# Patient Record
Sex: Female | Born: 1950 | Race: White | Hispanic: No | Marital: Married | Smoking: Never smoker
Health system: Southern US, Community
[De-identification: ages and names within clinical notes are randomized; demographics above are authoritative.]

## PROBLEM LIST (undated history)

## (undated) DIAGNOSIS — E785 Hyperlipidemia, unspecified: Secondary | ICD-10-CM

## (undated) DIAGNOSIS — T753XXA Motion sickness, initial encounter: Secondary | ICD-10-CM

## (undated) DIAGNOSIS — F32A Depression, unspecified: Secondary | ICD-10-CM

## (undated) DIAGNOSIS — M1990 Unspecified osteoarthritis, unspecified site: Secondary | ICD-10-CM

## (undated) DIAGNOSIS — F329 Major depressive disorder, single episode, unspecified: Secondary | ICD-10-CM

## (undated) DIAGNOSIS — I10 Essential (primary) hypertension: Secondary | ICD-10-CM

## (undated) DIAGNOSIS — R42 Dizziness and giddiness: Secondary | ICD-10-CM

## (undated) HISTORY — PX: CATARACT EXTRACTION: SUR2

## (undated) HISTORY — DX: Depression, unspecified: F32.A

## (undated) HISTORY — DX: Major depressive disorder, single episode, unspecified: F32.9

## (undated) HISTORY — DX: Essential (primary) hypertension: I10

## (undated) HISTORY — DX: Hyperlipidemia, unspecified: E78.5

---

## 1972-03-14 HISTORY — PX: TONSILECTOMY/ADENOIDECTOMY WITH MYRINGOTOMY: SHX6125

## 2003-10-30 LAB — HM COLONOSCOPY: HM Colonoscopy: NORMAL

## 2006-01-03 ENCOUNTER — Other Ambulatory Visit: Admission: RE | Admit: 2006-01-03 | Discharge: 2006-01-03 | Payer: Self-pay | Admitting: Internal Medicine

## 2006-02-09 ENCOUNTER — Encounter: Admission: RE | Admit: 2006-02-09 | Discharge: 2006-02-09 | Payer: Self-pay | Admitting: Internal Medicine

## 2006-10-24 ENCOUNTER — Ambulatory Visit: Payer: Self-pay | Admitting: Gastroenterology

## 2007-01-09 ENCOUNTER — Ambulatory Visit: Payer: Self-pay | Admitting: Internal Medicine

## 2008-01-02 ENCOUNTER — Ambulatory Visit: Payer: Self-pay | Admitting: Internal Medicine

## 2010-03-17 ENCOUNTER — Ambulatory Visit: Payer: Self-pay | Admitting: Specialist

## 2010-03-26 ENCOUNTER — Ambulatory Visit: Payer: Self-pay | Admitting: Specialist

## 2010-04-04 ENCOUNTER — Encounter: Payer: Self-pay | Admitting: Internal Medicine

## 2010-06-24 ENCOUNTER — Ambulatory Visit: Payer: Self-pay | Admitting: Family Medicine

## 2011-03-15 HISTORY — PX: TOE SURGERY: SHX1073

## 2011-03-19 ENCOUNTER — Telehealth: Payer: Self-pay | Admitting: *Deleted

## 2011-03-19 NOTE — Telephone Encounter (Signed)
OK for 1 month refill, then needs to be seen.

## 2011-03-19 NOTE — Telephone Encounter (Signed)
Pharm faxed RF request -  HCTZ 12.5mg  1 qd. OK for RF? Patient has not been seen and has no upcoming apts at this time.

## 2011-03-21 MED ORDER — HYDROCHLOROTHIAZIDE 12.5 MG PO CAPS: 12.5000 mg | ORAL_CAPSULE | Freq: Every day | ORAL | Status: DC

## 2011-03-21 NOTE — Telephone Encounter (Signed)
RF sent in, Please set pt up for 30 min OV w/Dr Dan Humphreys in the next 2 months. THANK YOU

## 2011-03-21 NOTE — Telephone Encounter (Signed)
Left message for pt to call office

## 2011-03-23 NOTE — Telephone Encounter (Signed)
Left message  For pt to call office

## 2011-03-25 NOTE — Telephone Encounter (Signed)
Tried call pt no answer on phone

## 2011-03-29 NOTE — Telephone Encounter (Signed)
Tried calling patient no answer on phone kept ringing

## 2011-03-31 ENCOUNTER — Encounter: Payer: Self-pay | Admitting: Internal Medicine

## 2011-04-04 NOTE — Telephone Encounter (Signed)
Trid callign patient no answer phone kept ringing

## 2011-11-30 LAB — HM PAP SMEAR: HM Pap smear: NORMAL

## 2011-12-08 ENCOUNTER — Ambulatory Visit: Payer: Self-pay | Admitting: Internal Medicine

## 2012-01-17 IMAGING — CT CT ABD-PELV W/ CM
1 of 2 series · 15 of 32 positions shown, 19 images · non-contrast
Comparison: none

REASON FOR EXAM: LLQ pain
COMMENTS:

PROCEDURE:     KCT - KCT ABDOMEN/PELVIS W  - June 24, 2010 [DATE]
RESULT:
TECHNIQUE: Helical 5 mm sections were obtained from the lung bases through
the pubic symphysis status post intravenous administration of 100 ml of
[id number] and oral contrast.

[Series 2: abd with 5.0 i40f 3 · axial · 0.98mm/px · z∈[-1094,-639]mm · 15 of 99 slices shown, 19 images]
[im 4/99  soft-tissue]
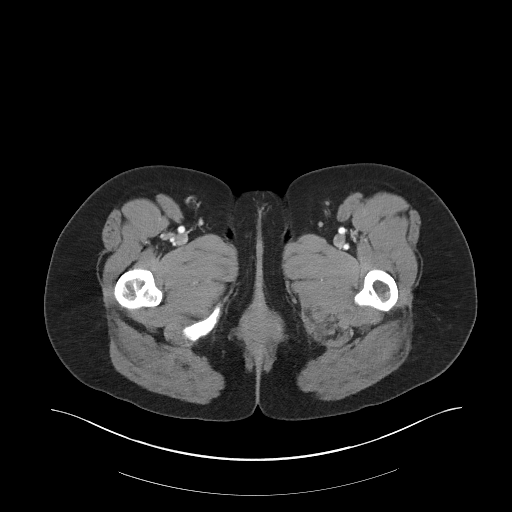
[im 4/99  bone]
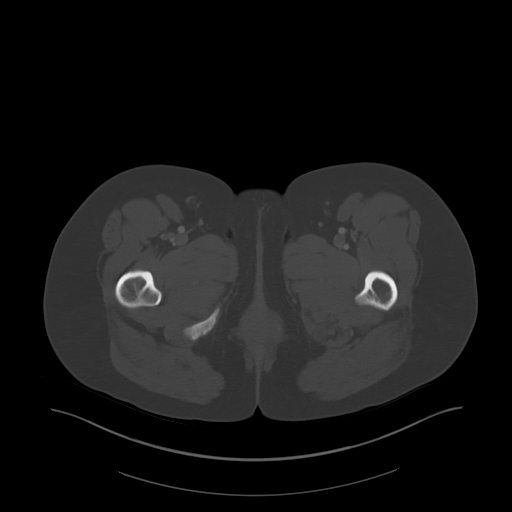
[im 12/99  soft-tissue]
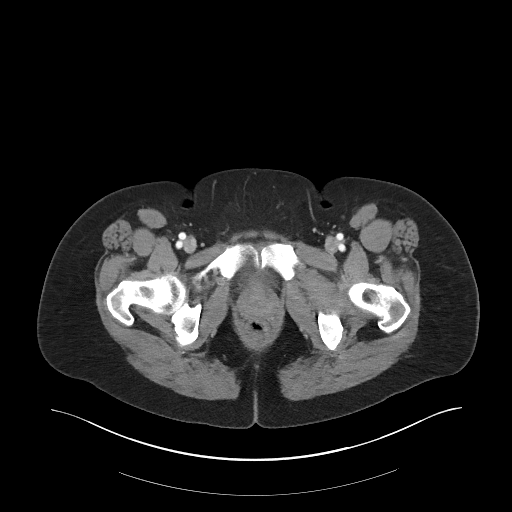
[im 20/99  soft-tissue]
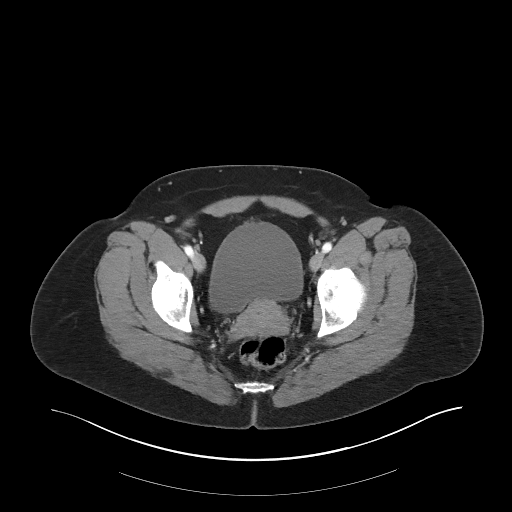
[im 28/99  soft-tissue]
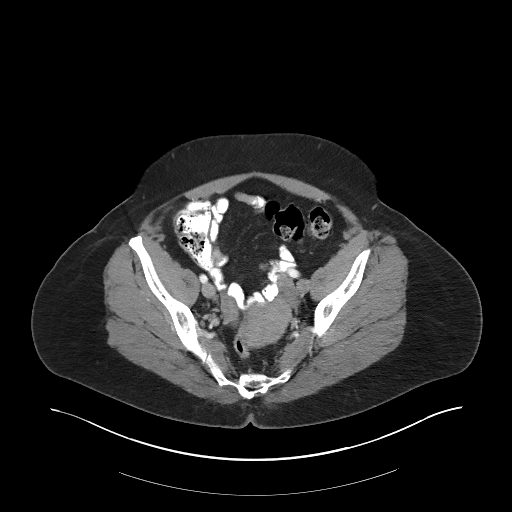
[im 36/99  soft-tissue]
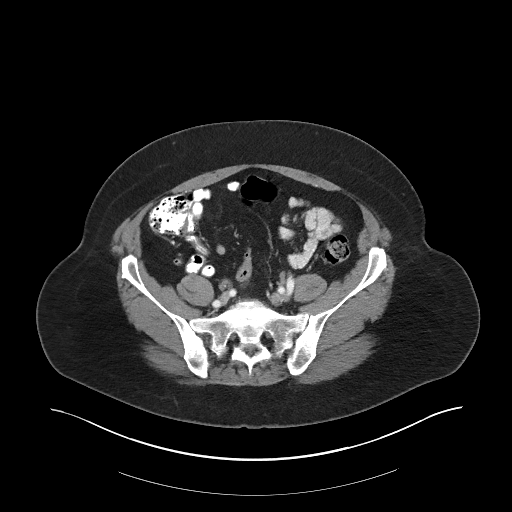
[im 44/99  soft-tissue]
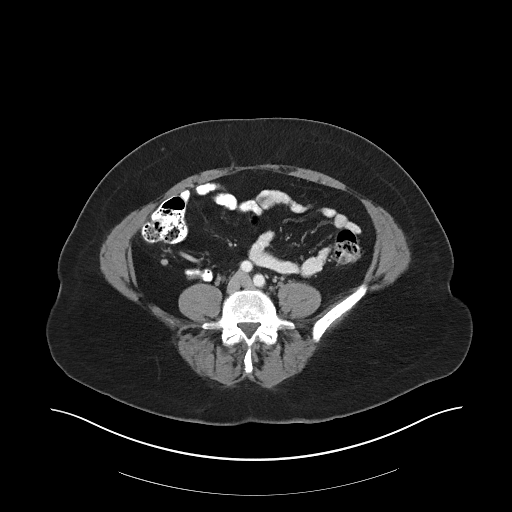
[im 51/99  soft-tissue]
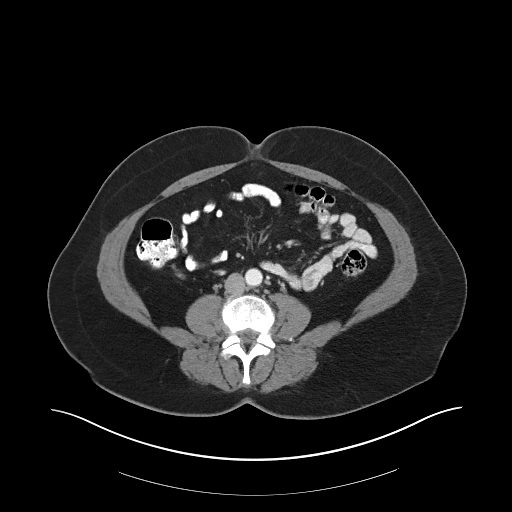
[im 55/99  soft-tissue]
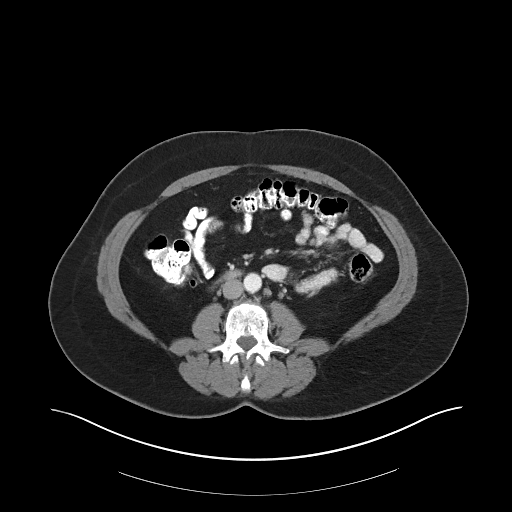
[im 63/99  soft-tissue]
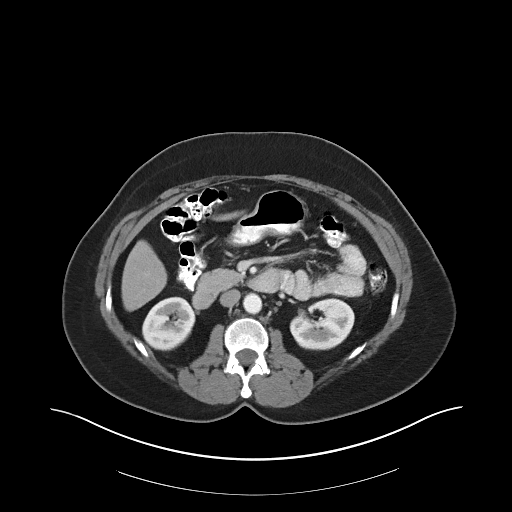
[im 63/99  bone]
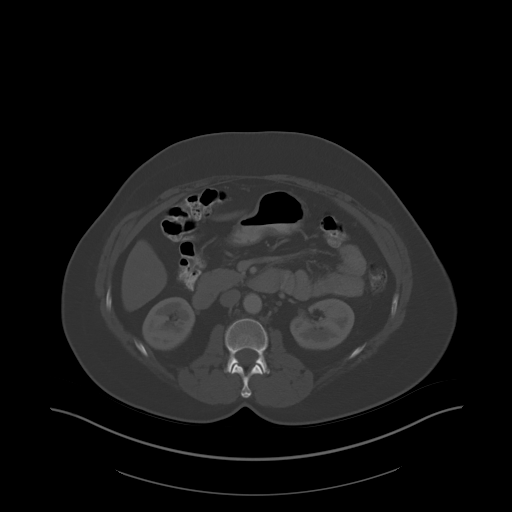
[im 71/99  soft-tissue]
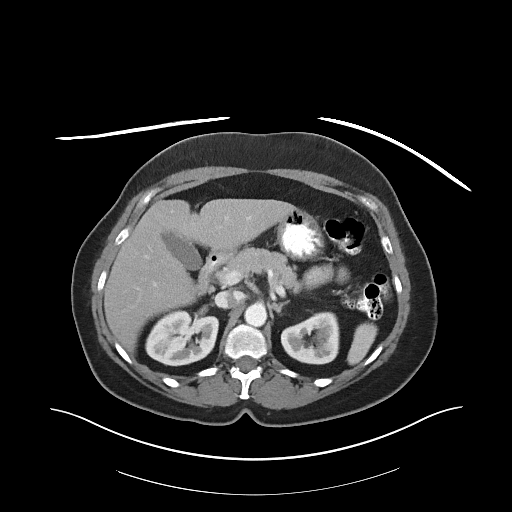
[im 79/99  soft-tissue]
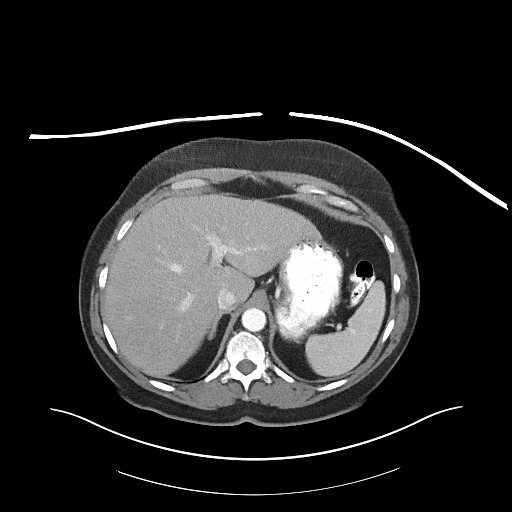
[im 83/99  lung]
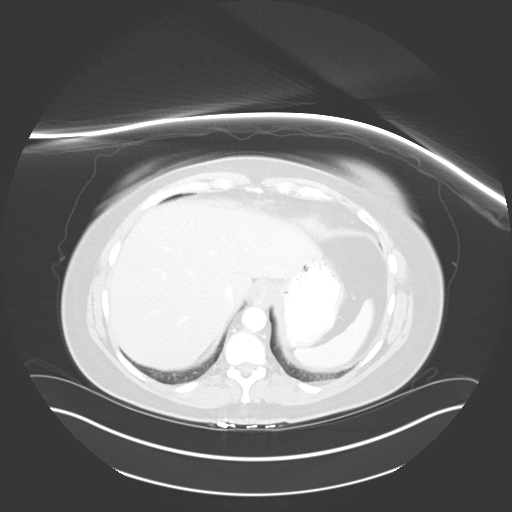
[im 87/99  soft-tissue]
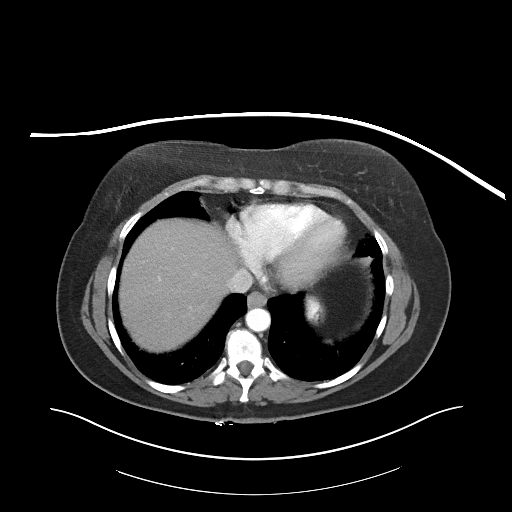
[im 87/99  lung]
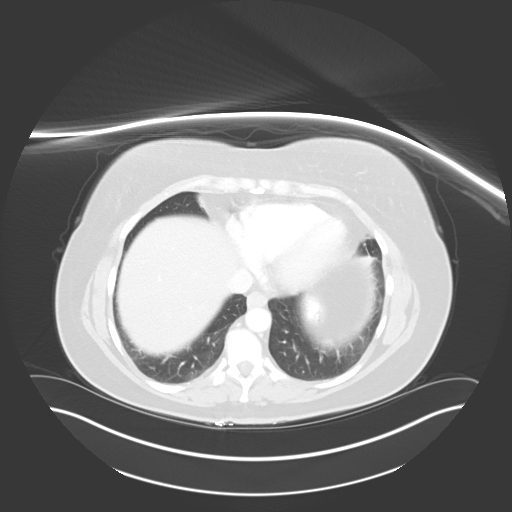
[im 91/99  lung]
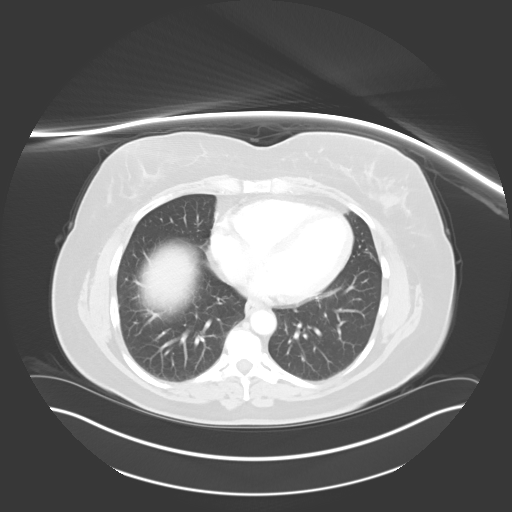
[im 95/99  soft-tissue]
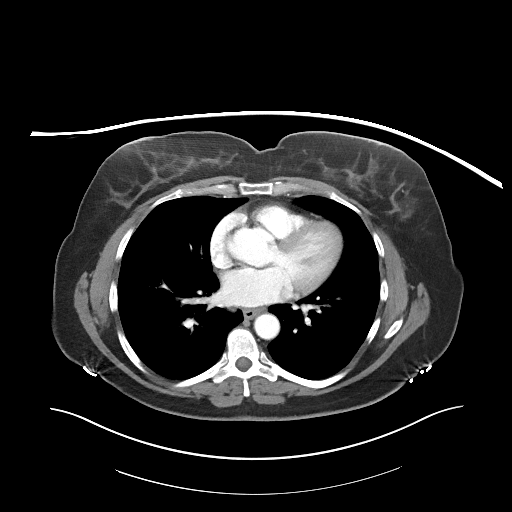
[im 95/99  lung]
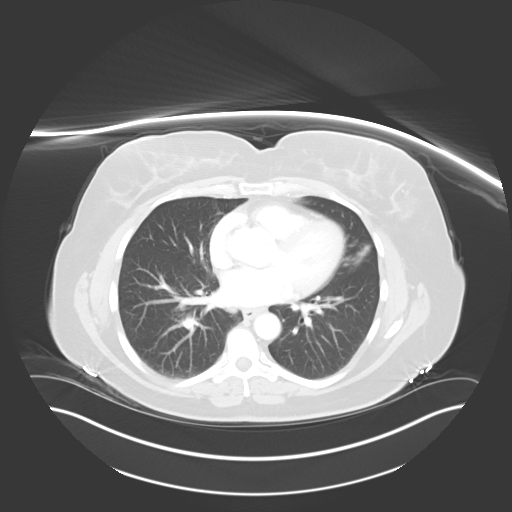

[15 of 32 positions shown; findings below may reference images not displayed]

FINDINGS: Mild, ill-defined increased density projects within the posterior
base of the lingula and likely representing an area of hypoventilation and
infiltrate is of much lower differential consideration. The lung bases are
otherwise unremarkable.

Evaluation of the liver demonstrates a 5 mm, hypoechoic nodule within the
medial segment of the left lobe of the liver. This area is too small for
characterization though likely represents a small biliary hamartoma versus a
small cyst or possibly a small hemangioma. This can be monitored, if and as
clinically warranted. No further liver masses or regions of abnormal
parenchymal enhancement are identified. The spleen, adrenals, pancreas, and
kidneys are unremarkable. There is no CT evidence of bowel obstruction or
secondary signs reflecting enteritis, colitis, diverticulitis or
appendicitis. The appendix is identified and appears unremarkable.

The celiac artery, SMA, IMA, SMV, and portal vein are opacified. There is no
evidence of an abdominal aortic aneurysm or dissection.

There is no evidence of abdominal or pelvic free fluid, loculated fluid
collections, masses, or adenopathy.
IMPRESSION: 1.  No CT evidence of obstructive or inflammatory abnormalities. There is no
evidence of focal or acute abnormalities.
2.  Likely hypoventilation at base of the lingula.
3.  Small, low attenuating indeterminate nodule within the liver with
differential considerations as described above.

## 2012-08-28 ENCOUNTER — Ambulatory Visit: Payer: Self-pay | Admitting: Internal Medicine

## 2012-10-29 ENCOUNTER — Encounter: Payer: Self-pay | Admitting: Internal Medicine

## 2012-10-29 ENCOUNTER — Ambulatory Visit (INDEPENDENT_AMBULATORY_CARE_PROVIDER_SITE_OTHER): Payer: BC Managed Care – PPO | Admitting: Internal Medicine

## 2012-10-29 ENCOUNTER — Encounter: Payer: Self-pay | Admitting: *Deleted

## 2012-10-29 VITALS — BP 126/90 | HR 72 | Temp 98.5°F | Ht 64.75 in | Wt 199.0 lb

## 2012-10-29 DIAGNOSIS — H6123 Impacted cerumen, bilateral: Secondary | ICD-10-CM

## 2012-10-29 DIAGNOSIS — H6120 Impacted cerumen, unspecified ear: Secondary | ICD-10-CM

## 2012-10-29 DIAGNOSIS — Z803 Family history of malignant neoplasm of breast: Secondary | ICD-10-CM

## 2012-10-29 DIAGNOSIS — Z0000 Encounter for general adult medical examination without abnormal findings: Secondary | ICD-10-CM

## 2012-10-29 DIAGNOSIS — R140 Abdominal distension (gaseous): Secondary | ICD-10-CM | POA: Insufficient documentation

## 2012-10-29 DIAGNOSIS — F4323 Adjustment disorder with mixed anxiety and depressed mood: Secondary | ICD-10-CM | POA: Insufficient documentation

## 2012-10-29 DIAGNOSIS — R141 Gas pain: Secondary | ICD-10-CM

## 2012-10-29 LAB — POCT URINALYSIS DIPSTICK
Ketones, UA: NEGATIVE
Protein, UA: NEGATIVE
Spec Grav, UA: 1.025
pH, UA: 7

## 2012-10-29 LAB — CBC WITH DIFFERENTIAL/PLATELET
Basophils Absolute: 0 10*3/uL (ref 0.0–0.1)
Eosinophils Absolute: 0.2 10*3/uL (ref 0.0–0.7)
Hemoglobin: 14.2 g/dL (ref 12.0–15.0)
Lymphocytes Relative: 33.2 % (ref 12.0–46.0)
Monocytes Relative: 4.7 % (ref 3.0–12.0)
Neutro Abs: 3.4 10*3/uL (ref 1.4–7.7)
Neutrophils Relative %: 59 % (ref 43.0–77.0)
Platelets: 265 10*3/uL (ref 150.0–400.0)
RDW: 13.7 % (ref 11.5–14.6)

## 2012-10-29 LAB — COMPREHENSIVE METABOLIC PANEL
CO2: 27 mEq/L (ref 19–32)
Calcium: 9.6 mg/dL (ref 8.4–10.5)
Chloride: 101 mEq/L (ref 96–112)
Creatinine, Ser: 0.9 mg/dL (ref 0.4–1.2)
GFR: 69.68 mL/min (ref >60.00)
Glucose, Bld: 94 mg/dL (ref 70–99)
Sodium: 138 mEq/L (ref 135–145)
Total Bilirubin: 0.3 mg/dL (ref 0.3–1.2)
Total Protein: 7.3 g/dL (ref 6.0–8.3)

## 2012-10-29 LAB — LIPID PANEL
Cholesterol: 255 mg/dL — ABNORMAL HIGH (ref 0–200)
HDL: 54.3 mg/dL (ref >39.00)
Triglycerides: 307 mg/dL — ABNORMAL HIGH (ref 0.0–149.0)
VLDL: 61.4 mg/dL — ABNORMAL HIGH (ref 0.0–40.0)

## 2012-10-29 LAB — TSH: TSH: 0.93 u[IU]/mL (ref 0.35–5.50)

## 2012-10-29 LAB — LDL CHOLESTEROL, DIRECT: Direct LDL: 166.7 mg/dL

## 2012-10-29 MED ORDER — ALPRAZOLAM 0.25 MG PO TABS: 0.2500 mg | ORAL_TABLET | Freq: Two times a day (BID) | ORAL | Status: DC | PRN

## 2012-10-29 MED ORDER — FLUOXETINE HCL 20 MG PO TABS: 20.0000 mg | ORAL_TABLET | Freq: Every day | ORAL | Status: DC

## 2012-10-29 NOTE — Assessment & Plan Note (Signed)
Symptoms are most consistent with adjustment disorder with mixed anxiety and depressed mood. Will start fluoxetine 20 mg daily. Will also use alprazolam as needed for episodes of severe anxiety. Followup in 4 weeks or sooner as needed.

## 2012-10-29 NOTE — Assessment & Plan Note (Signed)
Patient reports her sister was recently diagnosed with high-risk breast cancer with inherited genetic mutation. She will try to obtain records on this. Will set up genetic counseling at [facility] high-risk breast clinic. Will set up mammogram.

## 2012-10-29 NOTE — Assessment & Plan Note (Signed)
Several month history of abdominal bloating. Exam is normal today. Will get pelvic ultrasound for further evaluation of ovarian pathology. Note that patient reports a history in her sister of genetic mutation increasing risk for breast and ovarian cancer.

## 2012-10-29 NOTE — Progress Notes (Signed)
Subjective:    Patient ID: Angela Hogan, female    DOB: 09-18-1950, 62 y.o.   MRN: [id number]  HPI 62 year old female with history of hypertension, anxiety presents to establish care. Several years ago, she was seen in one of our other clinics. She would like to reestablish care here. She has not been taking any medications for several months. She has not been monitoring her blood pressure. She denies any chest pain, headache, palpitations. She does report increased anxiety and occasional depressed mood. She attributes this to ongoing family stressors as well as work stressors. She works as a Systems developer. Prior to the last few months, she has been taking alprazolam as needed for anxiety or sleep. She reports she tolerated this well. She also used sertraline in the past. She noted some improvement with this.  She reports that her sister was recently diagnosed with breast cancer. Her mother also had breast cancer. She reports that her sister was diagnosed with genetic mutation leading to breast cancer. She would like to be referred to genetic counseling for this. She would also like to have mammogram scheduled.  Over the last several months, she also notes some occasional diffuse pelvic bloating or pressure. Her previous doctor had ordered a pelvic ultrasound, approximately 3 years ago which she reports was normal. She denies any change in bowel habits, constipation, diarrhea, blood in her stool. She has a history of uterine fibroids and menorrhagia but has not had a menses in over 2 years. She also denies any symptoms of dysuria, hematuria, urinary urgency or frequency.  Outpatient Encounter Prescriptions as of 10/29/2012  Medication Sig Dispense Refill  . ALPRAZolam (XANAX) 0.25 MG tablet Take 1 tablet (0.25 mg total) by mouth 2 (two) times daily as needed for sleep or anxiety.  60 tablet  1   No facility-administered encounter medications on file as of 10/29/2012.   BP 126/90  Pulse 72   Temp(Src) 98.5 F (36.9 C) (Oral)  Ht 5' 4.75" (1.645 m)  Wt 199 lb (90.266 kg)  BMI 33.36 kg/m2  SpO2 95%  Review of Systems  Constitutional: Negative for fever, chills, appetite change, fatigue and unexpected weight change.  HENT: Negative for ear pain, congestion, sore throat, trouble swallowing, neck pain, voice change and sinus pressure.   Eyes: Negative for visual disturbance.  Respiratory: Negative for cough, shortness of breath, wheezing and stridor.   Cardiovascular: Negative for chest pain, palpitations and leg swelling.  Gastrointestinal: Positive for abdominal distention. Negative for nausea, vomiting, abdominal pain, diarrhea, constipation, blood in stool and anal bleeding.  Genitourinary: Negative for dysuria and flank pain.  Musculoskeletal: Negative for myalgias, arthralgias and gait problem.  Skin: Negative for color change and rash.  Neurological: Negative for dizziness and headaches.  Hematological: Negative for adenopathy. Does not bruise/bleed easily.  Psychiatric/Behavioral: Negative for suicidal ideas, sleep disturbance and dysphoric mood. The patient is not nervous/anxious.        Objective:   Physical Exam  Constitutional: She is oriented to person, place, and time. She appears well-developed and well-nourished. No distress.  HENT:  Head: Normocephalic and atraumatic.  Right Ear: External ear normal.  Left Ear: External ear normal.  Nose: Nose normal.  Mouth/Throat: Oropharynx is clear and moist. No oropharyngeal exudate.  Initially impacted with cerumen, which was removed with warm water lavage.  Eyes: Conjunctivae are normal. Pupils are equal, round, and reactive to light. Right eye exhibits no discharge. Left eye exhibits no discharge. No scleral icterus.  Neck: Normal  range of motion. Neck supple. No tracheal deviation present. No thyromegaly present.  Cardiovascular: Normal rate, regular rhythm, normal heart sounds and intact distal pulses.  Exam  reveals no gallop and no friction rub.   No murmur heard. Pulmonary/Chest: Effort normal and breath sounds normal. No accessory muscle usage. Not tachypneic. No respiratory distress. She has no decreased breath sounds. She has no wheezes. She has no rhonchi. She has no rales. She exhibits no tenderness.  Abdominal: Soft. Bowel sounds are normal. She exhibits no distension and no mass. There is no tenderness. There is no rebound and no guarding.  Musculoskeletal: Normal range of motion. She exhibits no edema and no tenderness.  Lymphadenopathy:    She has no cervical adenopathy.  Neurological: She is alert and oriented to person, place, and time. No cranial nerve deficit. She exhibits normal muscle tone. Coordination normal.  Skin: Skin is warm and dry. No rash noted. She is not diaphoretic. No erythema. No pallor.  Psychiatric: Her behavior is normal. Judgment and thought content normal. Her mood appears anxious.          Assessment & Plan:

## 2012-10-29 NOTE — Assessment & Plan Note (Signed)
Cerumen removed with warm water lavage. 

## 2012-10-30 ENCOUNTER — Encounter: Payer: Self-pay | Admitting: *Deleted

## 2012-10-31 ENCOUNTER — Ambulatory Visit: Payer: Self-pay | Admitting: Internal Medicine

## 2012-11-01 ENCOUNTER — Telehealth: Payer: Self-pay | Admitting: Internal Medicine

## 2012-11-01 NOTE — Telephone Encounter (Signed)
Left message to call back  

## 2012-11-01 NOTE — Telephone Encounter (Signed)
Ultrasound of the pelvis from 10/31/2012 showed nodular areas within the uterus consistent with fibroids. The largest measured 2.02x1.56x1.85 cm. Ovaries were normal.

## 2012-11-02 NOTE — Telephone Encounter (Signed)
Informed patient of her results, she verbally understood. However she stated she has pain sometimes that is really bad. Could you call something in for the pain when she have it, is there anything to do about her pain?

## 2012-11-02 NOTE — Telephone Encounter (Signed)
If she is having chronic pelvic pain, we should set up referral to GYN for further evaluation. In the interim, I would recommend trying Tylenol 1000mg  twice daily for pain and/or Ibuprofen 800mg  up to three times daily as needed for pain.

## 2012-11-03 ENCOUNTER — Encounter: Payer: Self-pay | Admitting: Internal Medicine

## 2012-11-05 NOTE — Telephone Encounter (Signed)
Left message for pt to return my call.

## 2012-11-09 NOTE — Telephone Encounter (Signed)
Patient never returned call  

## 2012-11-13 ENCOUNTER — Encounter: Payer: Self-pay | Admitting: Internal Medicine

## 2012-11-26 ENCOUNTER — Encounter: Payer: Self-pay | Admitting: *Deleted

## 2012-11-27 ENCOUNTER — Encounter: Payer: BC Managed Care – PPO | Admitting: Internal Medicine

## 2012-11-30 LAB — HM MAMMOGRAPHY: HM Mammogram: NORMAL

## 2012-12-20 ENCOUNTER — Ambulatory Visit: Payer: Self-pay

## 2012-12-21 ENCOUNTER — Encounter: Payer: Self-pay | Admitting: Internal Medicine

## 2013-01-17 ENCOUNTER — Other Ambulatory Visit: Payer: Self-pay

## 2013-05-02 ENCOUNTER — Ambulatory Visit: Payer: Self-pay

## 2013-06-04 ENCOUNTER — Other Ambulatory Visit: Payer: Self-pay | Admitting: Internal Medicine

## 2013-06-11 ENCOUNTER — Other Ambulatory Visit: Payer: Self-pay | Admitting: Internal Medicine

## 2013-07-09 ENCOUNTER — Other Ambulatory Visit: Payer: Self-pay | Admitting: Internal Medicine

## 2013-07-24 ENCOUNTER — Telehealth: Payer: Self-pay | Admitting: Internal Medicine

## 2013-07-24 NOTE — Telephone Encounter (Signed)
Pt will need an appt for a medication refill within the next 30 days

## 2013-07-24 NOTE — Telephone Encounter (Signed)
Pt left vm.  States she needs refill and appt.  Returned pt call.  States for rx fluoxetine her pharmacy told her we would have to call in again.  States she has been out for about a week.  Angela Hogan.  States they deliver and she wants [place] to call them so they will deliver her medication.  Appt made.

## 2013-07-24 NOTE — Telephone Encounter (Signed)
LMTCB, advised pt to call to make appt for further med refills.

## 2013-07-25 ENCOUNTER — Other Ambulatory Visit: Payer: Self-pay | Admitting: *Deleted

## 2013-07-25 MED ORDER — FLUOXETINE HCL 20 MG PO CAPS: 20.0000 mg | ORAL_CAPSULE | Freq: Every day | ORAL | Status: DC

## 2013-07-25 NOTE — Telephone Encounter (Signed)
Appt made and refill sent to pharmacy 

## 2013-08-08 ENCOUNTER — Ambulatory Visit (INDEPENDENT_AMBULATORY_CARE_PROVIDER_SITE_OTHER): Payer: BC Managed Care – PPO | Admitting: Internal Medicine

## 2013-08-08 ENCOUNTER — Encounter: Payer: Self-pay | Admitting: Internal Medicine

## 2013-08-08 VITALS — BP 138/90 | HR 75 | Temp 97.7°F | Ht 64.75 in | Wt 208.5 lb

## 2013-08-08 DIAGNOSIS — F4323 Adjustment disorder with mixed anxiety and depressed mood: Secondary | ICD-10-CM

## 2013-08-08 DIAGNOSIS — E669 Obesity, unspecified: Secondary | ICD-10-CM | POA: Insufficient documentation

## 2013-08-08 DIAGNOSIS — R5383 Other fatigue: Principal | ICD-10-CM

## 2013-08-08 DIAGNOSIS — I10 Essential (primary) hypertension: Secondary | ICD-10-CM | POA: Insufficient documentation

## 2013-08-08 DIAGNOSIS — Z0000 Encounter for general adult medical examination without abnormal findings: Secondary | ICD-10-CM

## 2013-08-08 DIAGNOSIS — R5381 Other malaise: Secondary | ICD-10-CM | POA: Insufficient documentation

## 2013-08-08 MED ORDER — FLUOXETINE HCL 20 MG PO CAPS: 20.0000 mg | ORAL_CAPSULE | Freq: Every day | ORAL | Status: DC

## 2013-08-08 MED ORDER — ALPRAZOLAM 0.25 MG PO TABS: 0.2500 mg | ORAL_TABLET | Freq: Two times a day (BID) | ORAL | Status: DC | PRN

## 2013-08-08 MED ORDER — HYDROCHLOROTHIAZIDE 12.5 MG PO CAPS: 12.5000 mg | ORAL_CAPSULE | Freq: Every day | ORAL | Status: DC

## 2013-08-08 MED ORDER — ZOSTER VACCINE LIVE 19400 UNT/0.65ML ~~LOC~~ SOLR
0.6500 mL | Freq: Once | SUBCUTANEOUS | Status: DC
Start: 2013-08-08 — End: 2013-10-30

## 2013-08-08 NOTE — Assessment & Plan Note (Signed)
Generalized fatigue noted by pt. Symptoms of fatigue and snoring concerning for sleep apnea. Will set up sleep study. Will check CBC, CMP, TSH with labs. Follow up in 10/2013.

## 2013-08-08 NOTE — Assessment & Plan Note (Signed)
Offered support today. Will continue Fluoxetine and prn alprazolam. Consider counseling.

## 2013-08-08 NOTE — Progress Notes (Signed)
Pre visit review using our clinic review tool, if applicable. No additional management support is needed unless otherwise documented below in the visit note. 

## 2013-08-08 NOTE — Assessment & Plan Note (Signed)
BP Readings from Last 3 Encounters:  08/08/13 138/90  10/29/12 126/90   Has been off BP meds. BP elevated. Will restart HCTZ. Follow up in 10/2013 to recheck BP and will have labs including renal function prior to this visit.

## 2013-08-08 NOTE — Assessment & Plan Note (Signed)
Wt Readings from Last 3 Encounters:  08/08/13 208 lb 8 oz (94.575 kg)  10/29/12 199 lb (90.266 kg)   Encouraged healthy diet and exercise with goal of weight loss. Will check CMP, TSH with labs.

## 2013-08-08 NOTE — Progress Notes (Signed)
Subjective:    Patient ID: Angela Hogan, female    DOB: 03/06/1951, 63 y.o.   MRN: [id number]  HPI 62YO female presents for follow up. Lost to follow up x1 year. Difficult time for her. Caring for parents and brother who is alcoholic.  Feeling tired all of the time. Snores at night. Concerned about weight gain. No chest pain, shortness of breath. Had stress test within 5 years which was normal per her report. Worried about her risk of heart disease with weight gain.   Review of Systems  Constitutional: Positive for fatigue. Negative for fever, chills, appetite change and unexpected weight change.  HENT: Negative for congestion, ear pain, sinus pressure, sore throat, trouble swallowing and voice change.   Eyes: Negative for visual disturbance.  Respiratory: Negative for cough, shortness of breath, wheezing and stridor.   Cardiovascular: Negative for chest pain, palpitations and leg swelling.  Gastrointestinal: Negative for nausea, vomiting, abdominal pain, diarrhea, constipation, blood in stool, abdominal distention and anal bleeding.  Genitourinary: Negative for dysuria and flank pain.  Musculoskeletal: Negative for arthralgias, gait problem, myalgias and neck pain.  Skin: Negative for color change and rash.  Neurological: Negative for dizziness and headaches.  Hematological: Negative for adenopathy. Does not bruise/bleed easily.  Psychiatric/Behavioral: Positive for sleep disturbance and dysphoric mood. Negative for suicidal ideas. The patient is nervous/anxious.        Objective:    BP 138/90  Pulse 75  Temp(Src) 97.7 F (36.5 C) (Oral)  Ht 5' 4.75" (1.645 m)  Wt 208 lb 8 oz (94.575 kg)  BMI 34.95 kg/m2  SpO2 95% Physical Exam  Constitutional: She is oriented to person, place, and time. She appears well-developed and well-nourished. No distress.  HENT:  Head: Normocephalic and atraumatic.  Right Ear: External ear normal.  Left Ear: External ear normal.  Nose: Nose  normal.  Mouth/Throat: Oropharynx is clear and moist. No oropharyngeal exudate.  Eyes: Conjunctivae are normal. Pupils are equal, round, and reactive to light. Right eye exhibits no discharge. Left eye exhibits no discharge. No scleral icterus.  Neck: Normal range of motion. Neck supple. No tracheal deviation present. No thyromegaly present.  Cardiovascular: Normal rate, regular rhythm, normal heart sounds and intact distal pulses.  Exam reveals no gallop and no friction rub.   No murmur heard. Pulmonary/Chest: Effort normal and breath sounds normal. No accessory muscle usage. Not tachypneic. No respiratory distress. She has no decreased breath sounds. She has no wheezes. She has no rhonchi. She has no rales. She exhibits no tenderness.  Musculoskeletal: Normal range of motion. She exhibits no edema and no tenderness.  Lymphadenopathy:    She has no cervical adenopathy.  Neurological: She is alert and oriented to person, place, and time. No cranial nerve deficit. She exhibits normal muscle tone. Coordination normal.  Skin: Skin is warm and dry. No rash noted. She is not diaphoretic. No erythema. No pallor.  Psychiatric: Her behavior is normal. Judgment and thought content normal. Her mood appears anxious. She exhibits a depressed mood.          Assessment & Plan:   Problem List Items Addressed This Visit     Unprioritized   Adjustment disorder with mixed anxiety and depressed mood     Offered support today. Will continue Fluoxetine and prn alprazolam. Consider counseling.    Relevant Medications      FLUoxetine (PROZAC) capsule      ALPRAZolam (XANAX) tablet   Essential hypertension, benign  BP Readings from Last 3 Encounters:  08/08/13 138/90  10/29/12 126/90   Has been off BP meds. BP elevated. Will restart HCTZ. Follow up in 10/2013 to recheck BP and will have labs including renal function prior to this visit.    Relevant Medications      hydrochlorothiazide (MICROZIDE)  12.5 MG capsule   Obesity (BMI 30-39.9)      Wt Readings from Last 3 Encounters:  08/08/13 208 lb 8 oz (94.575 kg)  10/29/12 199 lb (90.266 kg)   Encouraged healthy diet and exercise with goal of weight loss. Will check CMP, TSH with labs.    Other malaise and fatigue - Primary     Generalized fatigue noted by pt. Symptoms of fatigue and snoring concerning for sleep apnea. Will set up sleep study. Will check CBC, CMP, TSH with labs. Follow up in 10/2013.    Relevant Orders      Ambulatory referral to Sleep Studies   Routine general medical examination at a health care facility   Relevant Orders      CBC with Differential      Comprehensive metabolic panel      Lipid panel      Microalbumin / creatinine urine ratio      Vit D  25 hydroxy (rtn osteoporosis monitoring)      TSH       No Follow-up on file.

## 2013-08-27 ENCOUNTER — Telehealth: Payer: Self-pay | Admitting: *Deleted

## 2013-08-27 NOTE — Telephone Encounter (Signed)
Received a questionnaire for home sleep study from [facility].  Pt states that she is unable to have the study done at this time due to the bill she received from her husbands sleep study.

## 2013-10-04 ENCOUNTER — Telehealth: Payer: Self-pay | Admitting: *Deleted

## 2013-10-04 NOTE — Telephone Encounter (Signed)
Pt called stating that they have a newborn in the family and is wanting to know if she has ever had her "whooping cough" vaccine. No Tdap vaccination in our records. Okay for her to schedule a nurse visit to get this vaccination?

## 2013-10-04 NOTE — Telephone Encounter (Signed)
Yes, fine for her to get TdaP

## 2013-10-07 NOTE — Telephone Encounter (Signed)
Left vm notifying pt to call the office to schedule a nurse visit for her TdaP vaccination

## 2013-10-30 ENCOUNTER — Telehealth: Payer: Self-pay | Admitting: Internal Medicine

## 2013-10-30 ENCOUNTER — Encounter: Payer: Self-pay | Admitting: Internal Medicine

## 2013-10-30 ENCOUNTER — Ambulatory Visit (INDEPENDENT_AMBULATORY_CARE_PROVIDER_SITE_OTHER): Payer: BC Managed Care – PPO | Admitting: Internal Medicine

## 2013-10-30 VITALS — BP 120/88 | HR 70 | Temp 98.0°F | Ht 64.3 in | Wt 206.5 lb

## 2013-10-30 DIAGNOSIS — E669 Obesity, unspecified: Secondary | ICD-10-CM

## 2013-10-30 DIAGNOSIS — E785 Hyperlipidemia, unspecified: Secondary | ICD-10-CM

## 2013-10-30 DIAGNOSIS — D259 Leiomyoma of uterus, unspecified: Secondary | ICD-10-CM | POA: Insufficient documentation

## 2013-10-30 DIAGNOSIS — Z1211 Encounter for screening for malignant neoplasm of colon: Secondary | ICD-10-CM

## 2013-10-30 DIAGNOSIS — I10 Essential (primary) hypertension: Secondary | ICD-10-CM

## 2013-10-30 DIAGNOSIS — F4323 Adjustment disorder with mixed anxiety and depressed mood: Secondary | ICD-10-CM

## 2013-10-30 DIAGNOSIS — Z0000 Encounter for general adult medical examination without abnormal findings: Secondary | ICD-10-CM

## 2013-10-30 LAB — CBC WITH DIFFERENTIAL/PLATELET
BASOS ABS: 0 10*3/uL (ref 0.0–0.1)
Basophils Relative: 0.5 % (ref 0.0–3.0)
EOS ABS: 0.1 10*3/uL (ref 0.0–0.7)
Eosinophils Relative: 2.2 % (ref 0.0–5.0)
HEMATOCRIT: 40.8 % (ref 36.0–46.0)
HEMOGLOBIN: 13.8 g/dL (ref 12.0–15.0)
LYMPHS ABS: 2.4 10*3/uL (ref 0.7–4.0)
Lymphocytes Relative: 36.5 % (ref 12.0–46.0)
MCHC: 33.9 g/dL (ref 30.0–36.0)
MCV: 90.6 fl (ref 78.0–100.0)
MONOS PCT: 5.4 % (ref 3.0–12.0)
Monocytes Absolute: 0.4 10*3/uL (ref 0.1–1.0)
NEUTROS ABS: 3.6 10*3/uL (ref 1.4–7.7)
Neutrophils Relative %: 55.4 % (ref 43.0–77.0)
Platelets: 281 10*3/uL (ref 150.0–400.0)
RBC: 4.5 Mil/uL (ref 3.87–5.11)
RDW: 13.5 % (ref 11.5–15.5)
WBC: 6.6 10*3/uL (ref 4.0–10.5)

## 2013-10-30 LAB — COMPREHENSIVE METABOLIC PANEL
ALT: 27 U/L (ref 0–35)
AST: 20 U/L (ref 0–37)
Albumin: 3.9 g/dL (ref 3.5–5.2)
Alkaline Phosphatase: 63 U/L (ref 39–117)
BILIRUBIN TOTAL: 0.7 mg/dL (ref 0.2–1.2)
BUN: 15 mg/dL (ref 6–23)
CO2: 26 meq/L (ref 19–32)
CREATININE: 0.8 mg/dL (ref 0.4–1.2)
Calcium: 9.4 mg/dL (ref 8.4–10.5)
Chloride: 103 mEq/L (ref 96–112)
GFR: 76.42 mL/min (ref >60.00)
Glucose, Bld: 97 mg/dL (ref 70–99)
Potassium: 4.1 mEq/L (ref 3.5–5.1)
Sodium: 138 mEq/L (ref 135–145)
Total Protein: 7 g/dL (ref 6.0–8.3)

## 2013-10-30 LAB — TSH: TSH: 0.79 u[IU]/mL (ref 0.35–4.50)

## 2013-10-30 LAB — LIPID PANEL
Cholesterol: 285 mg/dL — ABNORMAL HIGH (ref 0–200)
HDL: 56.3 mg/dL (ref >39.00)
NonHDL: 228.7
Total CHOL/HDL Ratio: 5
Triglycerides: 237 mg/dL — ABNORMAL HIGH (ref 0.0–149.0)
VLDL: 47.4 mg/dL — AB (ref 0.0–40.0)

## 2013-10-30 LAB — MICROALBUMIN / CREATININE URINE RATIO
Creatinine,U: 53.9 mg/dL
MICROALB UR: 0.6 mg/dL (ref 0.0–1.9)
Microalb Creat Ratio: 1.1 mg/g (ref 0.0–30.0)

## 2013-10-30 LAB — LDL CHOLESTEROL, DIRECT: LDL DIRECT: 216 mg/dL

## 2013-10-30 MED ORDER — ALPRAZOLAM 0.25 MG PO TABS: 0.2500 mg | ORAL_TABLET | Freq: Three times a day (TID) | ORAL | Status: DC | PRN

## 2013-10-30 MED ORDER — PHENTERMINE HCL 37.5 MG PO CAPS: 37.5000 mg | ORAL_CAPSULE | ORAL | Status: DC

## 2013-10-30 NOTE — Telephone Encounter (Signed)
Relevant patient education assigned to patient using Emmi. ° °

## 2013-10-30 NOTE — Patient Instructions (Addendum)
Start Fluoxetine $RemoveBeforeDE'20mg'ltQpEkjHgUeCuon$  daily for 1 week, then increase to $RemoveBef'40mg'WKraREfZyq$  daily.  Follow up in 4 weeks.  Health Maintenance Adopting a healthy lifestyle and getting preventive care can go a long way to promote health and wellness. Talk with your health care provider about what schedule of regular examinations is right for you. This is a good chance for you to check in with your provider about disease prevention and staying healthy. In between checkups, there are plenty of things you can do on your own. Experts have done a lot of research about which lifestyle changes and preventive measures are most likely to keep you healthy. Ask your health care provider for more information. WEIGHT AND DIET  Eat a healthy diet  Be sure to include plenty of vegetables, fruits, low-fat dairy products, and lean protein.  Do not eat a lot of foods high in solid fats, added sugars, or salt.  Get regular exercise. This is one of the most important things you can do for your health.  Most adults should exercise for at least 150 minutes each week. The exercise should increase your heart rate and make you sweat (moderate-intensity exercise).  Most adults should also do strengthening exercises at least twice a week. This is in addition to the moderate-intensity exercise.  Maintain a healthy weight  Body mass index (BMI) is a measurement that can be used to identify possible weight problems. It estimates body fat based on height and weight. Your health care provider can help determine your BMI and help you achieve or maintain a healthy weight.  For females 23 years of age and older:   A BMI below 18.5 is considered underweight.  A BMI of 18.5 to 24.9 is normal.  A BMI of 25 to 29.9 is considered overweight.  A BMI of 30 and above is considered obese.  Watch levels of cholesterol and blood lipids  You should start having your blood tested for lipids and cholesterol at 61 years of age, then have this test every 5  years.  You may need to have your cholesterol levels checked more often if:  Your lipid or cholesterol levels are high.  You are older than 61 years of age.  You are at high risk for heart disease.  CANCER SCREENING   Lung Cancer  Lung cancer screening is recommended for adults 44-6 years old who are at high risk for lung cancer because of a history of smoking.  A yearly low-dose CT scan of the lungs is recommended for people who:  Currently smoke.  Have quit within the past 15 years.  Have at least a 30-pack-year history of smoking. A pack year is smoking an average of one pack of cigarettes a day for 1 year.  Yearly screening should continue until it has been 15 years since you quit.  Yearly screening should stop if you develop a health problem that would prevent you from having lung cancer treatment.  Breast Cancer  Practice breast self-awareness. This means understanding how your breasts normally appear and feel.  It also means doing regular breast self-exams. Let your health care provider know about any changes, no matter how small.  If you are in your 20s or 30s, you should have a clinical breast exam (CBE) by a health care provider every 1-3 years as part of a regular health exam.  If you are 72 or older, have a CBE every year. Also consider having a breast X-ray (mammogram) every year.  If you have  a family history of breast cancer, talk to your health care provider about genetic screening.  If you are at high risk for breast cancer, talk to your health care provider about having an MRI and a mammogram every year.  Breast cancer gene (BRCA) assessment is recommended for women who have family members with BRCA-related cancers. BRCA-related cancers include:  Breast.  Ovarian.  Tubal.  Peritoneal cancers.  Results of the assessment will determine the need for genetic counseling and BRCA1 and BRCA2 testing. Cervical Cancer Routine pelvic examinations to  screen for cervical cancer are no longer recommended for nonpregnant women who are considered low risk for cancer of the pelvic organs (ovaries, uterus, and vagina) and who do not have symptoms. A pelvic examination may be necessary if you have symptoms including those associated with pelvic infections. Ask your health care provider if a screening pelvic exam is right for you.   The Pap test is the screening test for cervical cancer for women who are considered at risk.  If you had a hysterectomy for a problem that was not cancer or a condition that could lead to cancer, then you no longer need Pap tests.  If you are older than 65 years, and you have had normal Pap tests for the past 10 years, you no longer need to have Pap tests.  If you have had past treatment for cervical cancer or a condition that could lead to cancer, you need Pap tests and screening for cancer for at least 20 years after your treatment.  If you no longer get a Pap test, assess your risk factors if they change (such as having a new sexual partner). This can affect whether you should start being screened again.  Some women have medical problems that increase their chance of getting cervical cancer. If this is the case for you, your health care provider may recommend more frequent screening and Pap tests.  The human papillomavirus (HPV) test is another test that may be used for cervical cancer screening. The HPV test looks for the virus that can cause cell changes in the cervix. The cells collected during the Pap test can be tested for HPV.  The HPV test can be used to screen women 58 years of age and older. Getting tested for HPV can extend the interval between normal Pap tests from three to five years.  An HPV test also should be used to screen women of any age who have unclear Pap test results.  After 62 years of age, women should have HPV testing as often as Pap tests.  Colorectal Cancer  This type of cancer can be  detected and often prevented.  Routine colorectal cancer screening usually begins at 61 years of age and continues through 61 years of age.  Your health care provider may recommend screening at an earlier age if you have risk factors for colon cancer.  Your health care provider may also recommend using home test kits to check for hidden blood in the stool.  A small camera at the end of a tube can be used to examine your colon directly (sigmoidoscopy or colonoscopy). This is done to check for the earliest forms of colorectal cancer.  Routine screening usually begins at age 16.  Direct examination of the colon should be repeated every 5-10 years through 61 years of age. However, you may need to be screened more often if early forms of precancerous polyps or small growths are found. Skin Cancer  Check your  skin from head to toe regularly.  Tell your health care provider about any new moles or changes in moles, especially if there is a change in a mole's shape or color.  Also tell your health care provider if you have a mole that is larger than the size of a pencil eraser.  Always use sunscreen. Apply sunscreen liberally and repeatedly throughout the day.  Protect yourself by wearing long sleeves, pants, a wide-brimmed hat, and sunglasses whenever you are outside. HEART DISEASE, DIABETES, AND HIGH BLOOD PRESSURE   Have your blood pressure checked at least every 1-2 years. High blood pressure causes heart disease and increases the risk of stroke.  If you are between 9 years and 46 years old, ask your health care provider if you should take aspirin to prevent strokes.  Have regular diabetes screenings. This involves taking a blood sample to check your fasting blood sugar level.  If you are at a normal weight and have a low risk for diabetes, have this test once every three years after 61 years of age.  If you are overweight and have a high risk for diabetes, consider being tested at a  younger age or more often. PREVENTING INFECTION  Hepatitis B  If you have a higher risk for hepatitis B, you should be screened for this virus. You are considered at high risk for hepatitis B if:  You were born in a country where hepatitis B is common. Ask your health care provider which countries are considered high risk.  Your parents were born in a high-risk country, and you have not been immunized against hepatitis B (hepatitis B vaccine).  You have HIV or AIDS.  You use needles to inject street drugs.  You live with someone who has hepatitis B.  You have had sex with someone who has hepatitis B.  You get hemodialysis treatment.  You take certain medicines for conditions, including cancer, organ transplantation, and autoimmune conditions. Hepatitis C  Blood testing is recommended for:  Everyone born from [birth year] through 1963.  Anyone with known risk factors for hepatitis C. Sexually transmitted infections (STIs)  You should be screened for sexually transmitted infections (STIs) including gonorrhea and chlamydia if:  You are sexually active and are younger than 61 years of age.  You are older than 61 years of age and your health care provider tells you that you are at risk for this type of infection.  Your sexual activity has changed since you were last screened and you are at an increased risk for chlamydia or gonorrhea. Ask your health care provider if you are at risk.  If you do not have HIV, but are at risk, it may be recommended that you take a prescription medicine daily to prevent HIV infection. This is called pre-exposure prophylaxis (PrEP). You are considered at risk if:  You are sexually active and do not regularly use condoms or know the HIV status of your partner(s).  You take drugs by injection.  You are sexually active with a partner who has HIV. Talk with your health care provider about whether you are at high risk of being infected with HIV. If you choose  to begin PrEP, you should first be tested for HIV. You should then be tested every 3 months for as long as you are taking PrEP.  PREGNANCY   If you are premenopausal and you may become pregnant, ask your health care provider about preconception counseling.  If you may become pregnant, take 400  to 800 micrograms (mcg) of folic acid every day.  If you want to prevent pregnancy, talk to your health care provider about birth control (contraception). OSTEOPOROSIS AND MENOPAUSE   Osteoporosis is a disease in which the bones lose minerals and strength with aging. This can result in serious bone fractures. Your risk for osteoporosis can be identified using a bone density scan.  If you are 47 years of age or older, or if you are at risk for osteoporosis and fractures, ask your health care provider if you should be screened.  Ask your health care provider whether you should take a calcium or vitamin D supplement to lower your risk for osteoporosis.  Menopause may have certain physical symptoms and risks.  Hormone replacement therapy may reduce some of these symptoms and risks. Talk to your health care provider about whether hormone replacement therapy is right for you.  HOME CARE INSTRUCTIONS   Schedule regular health, dental, and eye exams.  Stay current with your immunizations.   Do not use any tobacco products including cigarettes, chewing tobacco, or electronic cigarettes.  If you are pregnant, do not drink alcohol.  If you are breastfeeding, limit how much and how often you drink alcohol.  Limit alcohol intake to no more than 1 drink per day for nonpregnant women. One drink equals 12 ounces of beer, 5 ounces of wine, or 1 ounces of hard liquor.  Do not use street drugs.  Do not share needles.  Ask your health care provider for help if you need support or information about quitting drugs.  Tell your health care provider if you often feel depressed.  Tell your health care  provider if you have ever been abused or do not feel safe at home. Document Released: 09/13/2010 Document Revised: 07/15/2013 Document Reviewed: 01/30/2013 [facility] Patient Information 2015 [place], [place]. This information is not intended to replace advice given to you by your health care provider. Make sure you discuss any questions you have with your health care provider.

## 2013-10-30 NOTE — Progress Notes (Signed)
Subjective:    Patient ID: Angela Hogan, female    DOB: 10/04/1950, 63 y.o.   MRN: [id number]  HPI 62YO female presents for physical exam.  Recently trying to lose weight. Walking daily. Following healthy diet. Frustrated by lack of weight loss.  Also feels more irritable lately with depressed mood. Has stopped Fluoxetine. Father recently passed away last week. Continues to use Alprazolam prn at night for sleep.  Review of Systems  Constitutional: Negative for fever, chills, appetite change, fatigue and unexpected weight change.  Eyes: Negative for visual disturbance.  Respiratory: Negative for shortness of breath.   Cardiovascular: Negative for chest pain and leg swelling.  Gastrointestinal: Negative for nausea, vomiting, abdominal pain, diarrhea and constipation.  Musculoskeletal: Negative for arthralgias and myalgias.  Skin: Negative for color change and rash.  Hematological: Negative for adenopathy. Does not bruise/bleed easily.  Psychiatric/Behavioral: Positive for sleep disturbance and dysphoric mood. Negative for suicidal ideas. The patient is nervous/anxious.        Objective:    BP 120/88  Pulse 70  Temp(Src) 98 F (36.7 C) (Oral)  Ht 5' 4.3" (1.633 m)  Wt 206 lb 8 oz (93.668 kg)  BMI 35.13 kg/m2  SpO2 95% Physical Exam  Constitutional: She is oriented to person, place, and time. She appears well-developed and well-nourished. No distress.  HENT:  Head: Normocephalic and atraumatic.  Right Ear: External ear normal.  Left Ear: External ear normal.  Nose: Nose normal.  Mouth/Throat: Oropharynx is clear and moist. No oropharyngeal exudate.  Eyes: Conjunctivae are normal. Pupils are equal, round, and reactive to light. Right eye exhibits no discharge. Left eye exhibits no discharge. No scleral icterus.  Neck: Normal range of motion. Neck supple. No tracheal deviation present. No thyromegaly present.  Cardiovascular: Normal rate, regular rhythm, normal heart sounds  and intact distal pulses.  Exam reveals no gallop and no friction rub.   No murmur heard. Pulmonary/Chest: Effort normal and breath sounds normal. No accessory muscle usage. Not tachypneic. No respiratory distress. She has no decreased breath sounds. She has no wheezes. She has no rales. She exhibits no tenderness. Right breast exhibits no inverted nipple, no mass, no nipple discharge, no skin change and no tenderness. Left breast exhibits no inverted nipple, no mass, no nipple discharge, no skin change and no tenderness. Breasts are symmetrical.  Abdominal: Soft. Bowel sounds are normal. She exhibits no distension and no mass. There is no tenderness. There is no rebound and no guarding.  Musculoskeletal: Normal range of motion. She exhibits no edema and no tenderness.  Lymphadenopathy:    She has no cervical adenopathy.  Neurological: She is alert and oriented to person, place, and time. No cranial nerve deficit. She exhibits normal muscle tone. Coordination normal.  Skin: Skin is warm and dry. No rash noted. She is not diaphoretic. No erythema. No pallor.  Psychiatric: Her speech is normal and behavior is normal. Judgment and thought content normal. Her mood appears anxious. She expresses no suicidal ideation.          Assessment & Plan:   Problem List Items Addressed This Visit     Unprioritized   Adjustment disorder with mixed anxiety and depressed mood     Worsening symptoms of anxiety and depressed mood. Will start back on Fluoxetine, and titrate to 40mg  daily. Follow up in 4 weeks and prn.    Relevant Medications      ALPRAZolam  Duanne Moron) tablet   Essential hypertension, benign  BP Readings from Last 3 Encounters:  10/30/13 120/88  08/08/13 138/90  10/29/12 126/90   BP well controlled on medication. Will monitor closely. Renal function with labs.    Fibroid, uterine     H/o uterine fibroids, now with some intermittent bilateral pelvic pressure. Will set up GYN  evaluation. Question if she may need repeat US.    Relevant Orders      Ambulatory referral to Gynecology   Obesity (BMI 30-39.9)      Wt Readings from Last 3 Encounters:  10/30/13 206 lb 8 oz (93.668 kg)  08/08/13 208 lb 8 oz (94.575 kg)  10/29/12 199 lb (90.266 kg)   Body mass index is 35.13 kg/(m^2). Encouraged healthy diet and exercise. Will start phentermine to help with appetite suppression. Discussed potential risks of this medication. Follow up 4 weeks and prn.    Relevant Medications      phentermine capsule   Routine general medical examination at a health care facility - Primary     General medical exam normal today including breast exam. Pelvic deferred as appt schedule with OB. Mammogram ordered. Colonoscopy referral placed. Flu vaccine this fall. Labs today including CBC, CMP, lipids, TSH. Encouraged healthy diet and exercise.    Relevant Orders      MM Digital Screening   Special screening for malignant neoplasms, colon   Relevant Orders      Ambulatory referral to Gastroenterology       Return in about 4 weeks (around 11/27/2013) for Recheck.

## 2013-10-30 NOTE — Assessment & Plan Note (Signed)
Worsening symptoms of anxiety and depressed mood. Will start back on Fluoxetine, and titrate to 40mg  daily. Follow up in 4 weeks and prn.

## 2013-10-30 NOTE — Assessment & Plan Note (Signed)
General medical exam normal today including breast exam. Pelvic deferred as appt schedule with OB. Mammogram ordered. Colonoscopy referral placed. Flu vaccine this fall. Labs today including CBC, CMP, lipids, TSH. Encouraged healthy diet and exercise.

## 2013-10-30 NOTE — Assessment & Plan Note (Signed)
Wt Readings from Last 3 Encounters:  10/30/13 206 lb 8 oz (93.668 kg)  08/08/13 208 lb 8 oz (94.575 kg)  10/29/12 199 lb (90.266 kg)   Body mass index is 35.13 kg/(m^2). Encouraged healthy diet and exercise. Will start phentermine to help with appetite suppression. Discussed potential risks of this medication. Follow up 4 weeks and prn.

## 2013-10-30 NOTE — Assessment & Plan Note (Signed)
H/o uterine fibroids, now with some intermittent bilateral pelvic pressure. Will set up GYN evaluation. Question if she may need repeat US.

## 2013-10-30 NOTE — Assessment & Plan Note (Signed)
BP Readings from Last 3 Encounters:  10/30/13 120/88  08/08/13 138/90  10/29/12 126/90   BP well controlled on medication. Will monitor closely. Renal function with labs.

## 2013-10-30 NOTE — Progress Notes (Signed)
Pre visit review using our clinic review tool, if applicable. No additional management support is needed unless otherwise documented below in the visit note. 

## 2013-10-31 ENCOUNTER — Other Ambulatory Visit: Payer: Self-pay | Admitting: *Deleted

## 2013-10-31 LAB — VITAMIN D 25 HYDROXY (VIT D DEFICIENCY, FRACTURES): VITD: 23.34 ng/mL — AB (ref 30.00–100.00)

## 2013-10-31 MED ORDER — ATORVASTATIN CALCIUM 20 MG PO TABS: 20.0000 mg | ORAL_TABLET | Freq: Every day | ORAL | Status: DC

## 2013-10-31 NOTE — Addendum Note (Signed)
Addended by: Karlene Einstein D on: 10/31/2013 01:17 PM   Modules accepted: Orders

## 2013-12-05 ENCOUNTER — Ambulatory Visit (INDEPENDENT_AMBULATORY_CARE_PROVIDER_SITE_OTHER): Payer: BC Managed Care – PPO | Admitting: Internal Medicine

## 2013-12-05 ENCOUNTER — Encounter: Payer: Self-pay | Admitting: Internal Medicine

## 2013-12-05 VITALS — BP 124/88 | HR 72 | Temp 97.9°F | Ht 64.3 in | Wt 201.8 lb

## 2013-12-05 DIAGNOSIS — Z23 Encounter for immunization: Secondary | ICD-10-CM

## 2013-12-05 DIAGNOSIS — E7849 Other hyperlipidemia: Secondary | ICD-10-CM

## 2013-12-05 DIAGNOSIS — F4323 Adjustment disorder with mixed anxiety and depressed mood: Secondary | ICD-10-CM

## 2013-12-05 DIAGNOSIS — E785 Hyperlipidemia, unspecified: Secondary | ICD-10-CM

## 2013-12-05 DIAGNOSIS — E669 Obesity, unspecified: Secondary | ICD-10-CM

## 2013-12-05 NOTE — Assessment & Plan Note (Signed)
Wt Readings from Last 3 Encounters:  12/05/13 201 lb 12 oz (91.513 kg)  10/30/13 206 lb 8 oz (93.668 kg)  08/08/13 208 lb 8 oz (94.575 kg)   Body mass index is 34.32 kg/(m^2). Encouraged healthy diet and exercise. Congratulated pt on weight loss. Will continue phentermine for appetite suppression. Follow up in 6 weeks.

## 2013-12-05 NOTE — Assessment & Plan Note (Signed)
Symptoms of depression improved with Fluoxetine. Will continue.

## 2013-12-05 NOTE — Progress Notes (Signed)
Pre visit review using our clinic review tool, if applicable. No additional management support is needed unless otherwise documented below in the visit note. 

## 2013-12-05 NOTE — Patient Instructions (Signed)
Labs in 4 weeks.  Follow up in 6 weeks.

## 2013-12-05 NOTE — Progress Notes (Signed)
Subjective:    Patient ID: Angela Hogan, female    DOB: [birth year], 63 y.o.   MRN: [id number]  HPI 63YO female presents for follow up.  Depression - Recently started back on Fluoxetine. Symptoms of depressed mood have been improved, however it continues to be a difficult time for her family financially. She feels that she is coping well.  Obesity - Started on phentermine. Notes significant improvement in appetite. Has lost 5lbs. Walking for exercise. No side effects noted to phentermine.  Hyperlipidemia - Recently found to have LDL >200. Started on Atorvastatin, however has only been taking occasionally. Notes that both parents and siblings all have high cholesterol.   Review of Systems  Constitutional: Negative for fever, chills, appetite change, fatigue and unexpected weight change.  Eyes: Negative for visual disturbance.  Respiratory: Negative for shortness of breath.   Cardiovascular: Negative for chest pain and leg swelling.  Gastrointestinal: Negative for vomiting, abdominal pain, diarrhea, constipation and rectal pain.  Skin: Negative for color change and rash.  Hematological: Negative for adenopathy. Does not bruise/bleed easily.  Psychiatric/Behavioral: Negative for sleep disturbance and dysphoric mood. The patient is not nervous/anxious.        Objective:    BP 124/88  Pulse 72  Temp(Src) 97.9 F (36.6 C) (Oral)  Ht 5' 4.3" (1.633 m)  Wt 201 lb 12 oz (91.513 kg)  BMI 34.32 kg/m2  SpO2 95% Physical Exam  Constitutional: She is oriented to person, place, and time. She appears well-developed and well-nourished. No distress.  HENT:  Head: Normocephalic and atraumatic.  Right Ear: External ear normal.  Left Ear: External ear normal.  Nose: Nose normal.  Mouth/Throat: Oropharynx is clear and moist. No oropharyngeal exudate.  Eyes: Conjunctivae are normal. Pupils are equal, round, and reactive to light. Right eye exhibits no discharge. Left eye exhibits no  discharge. No scleral icterus.  Neck: Normal range of motion. Neck supple. No tracheal deviation present. No thyromegaly present.  Cardiovascular: Normal rate, regular rhythm, normal heart sounds and intact distal pulses.  Exam reveals no gallop and no friction rub.   No murmur heard. Pulmonary/Chest: Effort normal and breath sounds normal. No accessory muscle usage. Not tachypneic. No respiratory distress. She has no decreased breath sounds. She has no wheezes. She has no rhonchi. She has no rales. She exhibits no tenderness.  Musculoskeletal: Normal range of motion. She exhibits no edema and no tenderness.  Lymphadenopathy:    She has no cervical adenopathy.  Neurological: She is alert and oriented to person, place, and time. No cranial nerve deficit. She exhibits normal muscle tone. Coordination normal.  Skin: Skin is warm and dry. No rash noted. She is not diaphoretic. No erythema. No pallor.  Psychiatric: She has a normal mood and affect. Her behavior is normal. Judgment and thought content normal.          Assessment & Plan:   Problem List Items Addressed This Visit     Unprioritized   Adjustment disorder with mixed anxiety and depressed mood - Primary     Symptoms of depression improved with Fluoxetine. Will continue.    Familial hyperlipidemia     LDL>200 consistent with familial hyperlipidemia. Encouraged compliance with atorvastatin. Will recheck lipids and LFTs in 4 weeks.    Relevant Orders      Comprehensive metabolic panel      Lipid panel   Obesity (BMI 30-39.9)      Wt Readings from Last 3 Encounters:  12/05/13 201 lb 12  oz (91.513 kg)  10/30/13 206 lb 8 oz (93.668 kg)  08/08/13 208 lb 8 oz (94.575 kg)   Body mass index is 34.32 kg/(m^2). Encouraged healthy diet and exercise. Congratulated pt on weight loss. Will continue phentermine for appetite suppression. Follow up in 6 weeks.        Return in about 6 weeks (around 01/16/2014) for Recheck.

## 2013-12-05 NOTE — Assessment & Plan Note (Signed)
LDL>200 consistent with familial hyperlipidemia. Encouraged compliance with atorvastatin. Will recheck lipids and LFTs in 4 weeks.

## 2013-12-31 LAB — HM PAP SMEAR: HM PAP: NEGATIVE

## 2014-01-13 ENCOUNTER — Other Ambulatory Visit: Payer: Self-pay | Admitting: Internal Medicine

## 2014-01-13 NOTE — Telephone Encounter (Signed)
Faxed to pharmacy

## 2014-01-13 NOTE — Telephone Encounter (Signed)
Last appt 12/05/13, next visit 02/11/14, refill?

## 2014-01-16 ENCOUNTER — Ambulatory Visit: Payer: BC Managed Care – PPO | Admitting: Internal Medicine

## 2014-02-11 ENCOUNTER — Ambulatory Visit: Payer: BC Managed Care – PPO | Admitting: Internal Medicine

## 2014-02-19 ENCOUNTER — Other Ambulatory Visit: Payer: Self-pay | Admitting: Internal Medicine

## 2014-02-19 NOTE — Telephone Encounter (Signed)
Last OV 9.24.15, last refill 11.2.15.  Please advise refill

## 2014-05-08 ENCOUNTER — Ambulatory Visit: Payer: Self-pay | Admitting: Internal Medicine

## 2014-05-13 ENCOUNTER — Ambulatory Visit (INDEPENDENT_AMBULATORY_CARE_PROVIDER_SITE_OTHER): Payer: BC Managed Care – PPO | Admitting: Internal Medicine

## 2014-05-13 ENCOUNTER — Encounter: Payer: Self-pay | Admitting: Internal Medicine

## 2014-05-13 VITALS — BP 136/85 | HR 74 | Temp 97.7°F | Wt 201.5 lb

## 2014-05-13 DIAGNOSIS — E669 Obesity, unspecified: Secondary | ICD-10-CM

## 2014-05-13 DIAGNOSIS — E785 Hyperlipidemia, unspecified: Secondary | ICD-10-CM

## 2014-05-13 DIAGNOSIS — I10 Essential (primary) hypertension: Secondary | ICD-10-CM

## 2014-05-13 DIAGNOSIS — F4323 Adjustment disorder with mixed anxiety and depressed mood: Secondary | ICD-10-CM

## 2014-05-13 DIAGNOSIS — E7849 Other hyperlipidemia: Secondary | ICD-10-CM

## 2014-05-13 MED ORDER — ATORVASTATIN CALCIUM 20 MG PO TABS: 20.0000 mg | ORAL_TABLET | Freq: Every day | ORAL | Status: DC

## 2014-05-13 MED ORDER — FLUOXETINE HCL 20 MG PO CAPS: 20.0000 mg | ORAL_CAPSULE | Freq: Every day | ORAL | Status: DC

## 2014-05-13 MED ORDER — PHENTERMINE HCL 37.5 MG PO CAPS: 37.5000 mg | ORAL_CAPSULE | ORAL | Status: DC

## 2014-05-13 MED ORDER — ALPRAZOLAM 0.25 MG PO TABS: 0.2500 mg | ORAL_TABLET | Freq: Three times a day (TID) | ORAL | Status: DC | PRN

## 2014-05-13 NOTE — Patient Instructions (Signed)
Restart medication.  Labs in 1 month, fasting.  Follow up in 1 month.

## 2014-05-13 NOTE — Progress Notes (Signed)
Pre visit review using our clinic review tool, if applicable. No additional management support is needed unless otherwise documented below in the visit note. 

## 2014-05-13 NOTE — Assessment & Plan Note (Signed)
Recent worsening symptoms of anxiety and depression. Will restart Fluoxetine. Continue prn Alprazolam. Follow up in 4 weeks and prn.

## 2014-05-13 NOTE — Assessment & Plan Note (Signed)
BP Readings from Last 3 Encounters:  05/13/14 136/85  12/05/13 124/88  10/30/13 120/88   BP slightly elevated today. Will recheck in 4 weeks.

## 2014-05-13 NOTE — Assessment & Plan Note (Signed)
Wt Readings from Last 3 Encounters:  05/13/14 201 lb 8 oz (91.4 kg)  12/05/13 201 lb 12 oz (91.513 kg)  10/30/13 206 lb 8 oz (93.668 kg)   Body mass index is 34.27 kg/(m^2). Encouraged healthy diet and exercise. Will restart phentermine to help with appetite suppression. Follow up in 4 weeks for BP and weight check.

## 2014-05-13 NOTE — Progress Notes (Signed)
Subjective:    Patient ID: Angela Hogan, female    DOB: 10/08/1950, 64 y.o.   MRN: [id number]  HPI  64YO female presents for follow up.  Has been out of medication for over 3 months. Would like to start back on medication. Notes she is more irritable off medication. Frequently snaps at others, particularly family members. Difficult time for her with death of her father. Also working full time. Feels depressed at times.  Would like to get back on track and work on losing weight. Trying to follow a healthy diet.  She canceled recent Colonoscopy, but plans to reschedule.  Past medical, surgical, family and social history per today's encounter.  Wt Readings from Last 3 Encounters:  05/13/14 201 lb 8 oz (91.4 kg)  12/05/13 201 lb 12 oz (91.513 kg)  10/30/13 206 lb 8 oz (93.668 kg)    Review of Systems  Constitutional: Negative for fever, chills, appetite change, fatigue and unexpected weight change.  Eyes: Negative for visual disturbance.  Respiratory: Negative for shortness of breath.   Cardiovascular: Negative for chest pain and leg swelling.  Gastrointestinal: Negative for nausea, vomiting, abdominal pain, diarrhea and constipation.  Skin: Negative for color change and rash.  Hematological: Negative for adenopathy. Does not bruise/bleed easily.  Psychiatric/Behavioral: Positive for behavioral problems, sleep disturbance, dysphoric mood and agitation. Negative for suicidal ideas. The patient is nervous/anxious.        Objective:    BP 136/85 mmHg  Pulse 74  Temp(Src) 97.7 F (36.5 C) (Oral)  Wt 201 lb 8 oz (91.4 kg)  SpO2 96% Physical Exam  Constitutional: She is oriented to person, place, and time. She appears well-developed and well-nourished. No distress.  HENT:  Head: Normocephalic and atraumatic.  Right Ear: External ear normal.  Left Ear: External ear normal.  Nose: Nose normal.  Mouth/Throat: Oropharynx is clear and moist. No oropharyngeal exudate.  Eyes:  Conjunctivae are normal. Pupils are equal, round, and reactive to light. Right eye exhibits no discharge. Left eye exhibits no discharge. No scleral icterus.  Neck: Normal range of motion. Neck supple. No tracheal deviation present. No thyromegaly present.  Cardiovascular: Normal rate, regular rhythm, normal heart sounds and intact distal pulses.  Exam reveals no gallop and no friction rub.   No murmur heard. Pulmonary/Chest: Effort normal and breath sounds normal. No respiratory distress. She has no wheezes. She has no rales. She exhibits no tenderness.  Musculoskeletal: Normal range of motion. She exhibits no edema or tenderness.  Lymphadenopathy:    She has no cervical adenopathy.  Neurological: She is alert and oriented to person, place, and time. No cranial nerve deficit. She exhibits normal muscle tone. Coordination normal.  Skin: Skin is warm and dry. No rash noted. She is not diaphoretic. No erythema. No pallor.  Psychiatric: Her speech is normal and behavior is normal. Judgment and thought content normal. Her mood appears anxious. Cognition and memory are normal. She exhibits a depressed mood. She expresses no suicidal ideation.          Assessment & Plan:   Problem List Items Addressed This Visit      Unprioritized   Adjustment disorder with mixed anxiety and depressed mood - Primary    Recent worsening symptoms of anxiety and depression. Will restart Fluoxetine. Continue prn Alprazolam. Follow up in 4 weeks and prn.      Relevant Medications   FLUoxetine (PROZAC) capsule   ALPRAZolam  (XANAX) tablet   Essential hypertension, benign  BP Readings from Last 3 Encounters:  05/13/14 136/85  12/05/13 124/88  10/30/13 120/88   BP slightly elevated today. Will recheck in 4 weeks.       Relevant Medications   atorvastatin (LIPITOR) tablet   Familial hyperlipidemia    Labs are consistent with familial HL. Discussed importance of taking Atorvastatin. Will recheck LFTs and  lipids in 1 month.      Relevant Medications   atorvastatin (LIPITOR) tablet   Other Relevant Orders   Comprehensive metabolic panel   Lipid panel   Obesity (BMI 30-39.9)    Wt Readings from Last 3 Encounters:  05/13/14 201 lb 8 oz (91.4 kg)  12/05/13 201 lb 12 oz (91.513 kg)  10/30/13 206 lb 8 oz (93.668 kg)   Body mass index is 34.27 kg/(m^2). Encouraged healthy diet and exercise. Will restart phentermine to help with appetite suppression. Follow up in 4 weeks for BP and weight check.      Relevant Medications   phentermine capsule       Return in about 4 weeks (around 06/10/2014) for Recheck.

## 2014-05-13 NOTE — Assessment & Plan Note (Signed)
Labs are consistent with familial HL. Discussed importance of taking Atorvastatin. Will recheck LFTs and lipids in 1 month.

## 2014-05-26 IMAGING — US TRANSABDOMINAL ULTRASOUND OF PELVIS
1 series · 14 of 25 positions shown · non-contrast
Comparison: none

REASON FOR EXAM: lower abd bloating
COMMENTS:

PROCEDURE:     BLASKOVICS - BLASKOVICS PELVIS NON-OB W/TRANSVAGINAL  - October 31, 2012 [DATE]
RESULT:
Transabdominal and endovaginal imaging (complete) of the pelvis was obtained.

[Series 1: transabdominal ultrasound of pelvis · 0.31mm/px · 14 of 79 slices shown]
[im 1/79]
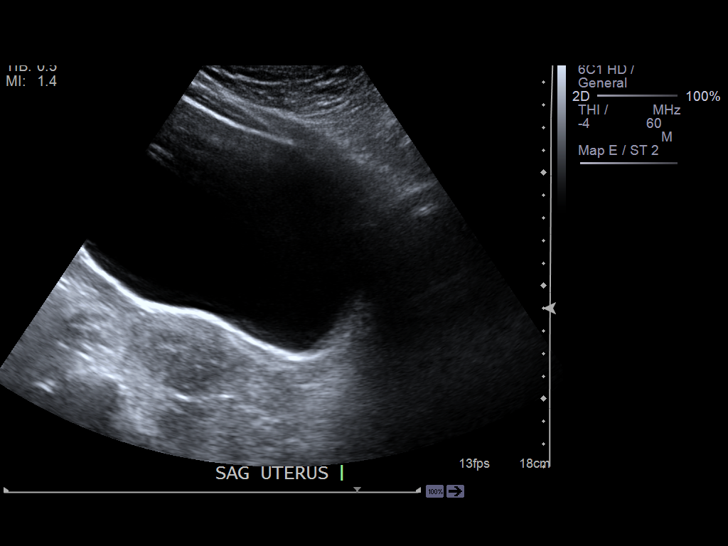
[im 7/79]
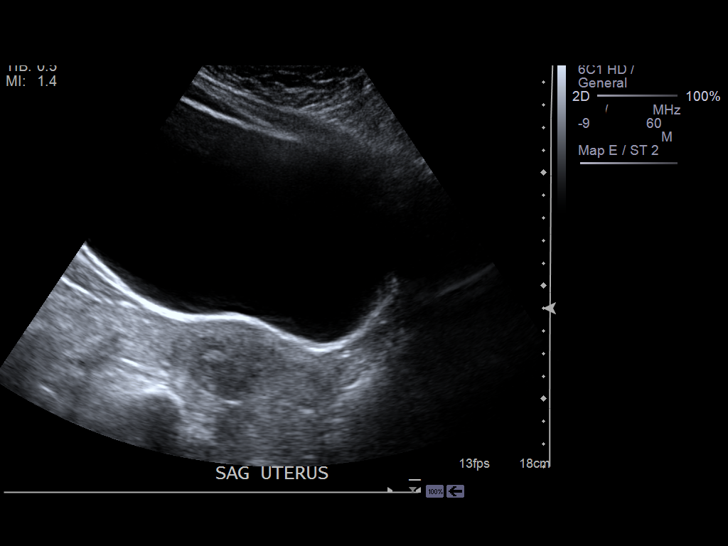
[im 14/79]
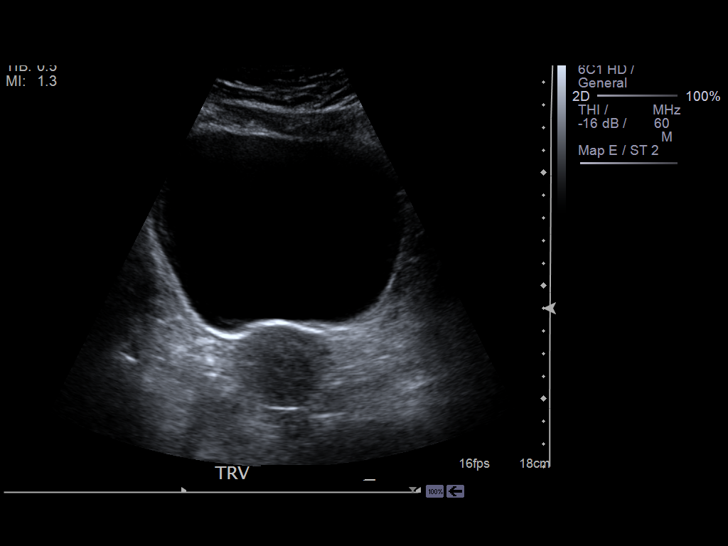
[im 20/79]
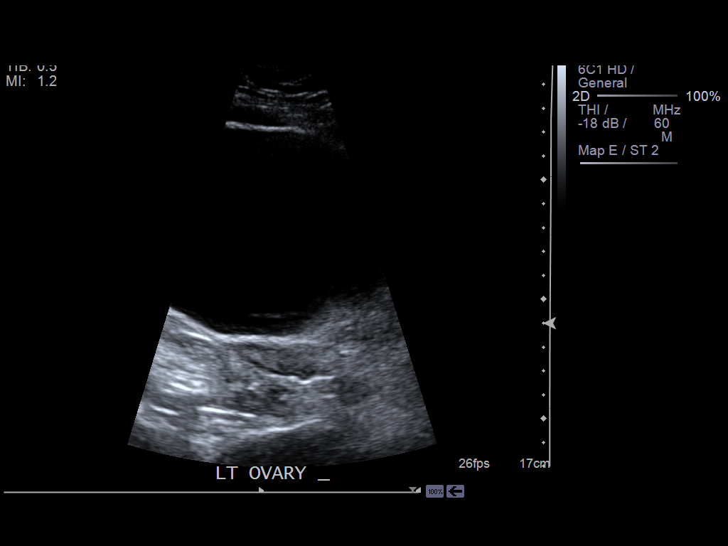
[im 27/79]
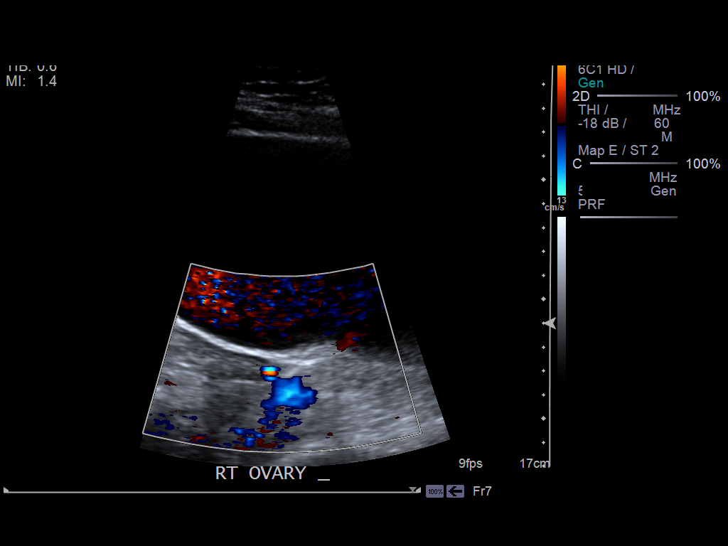
[im 30/79]
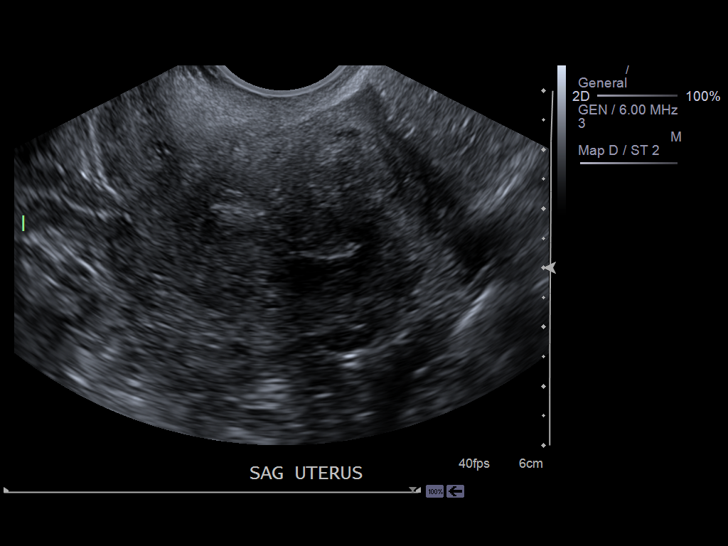
[im 36/79]
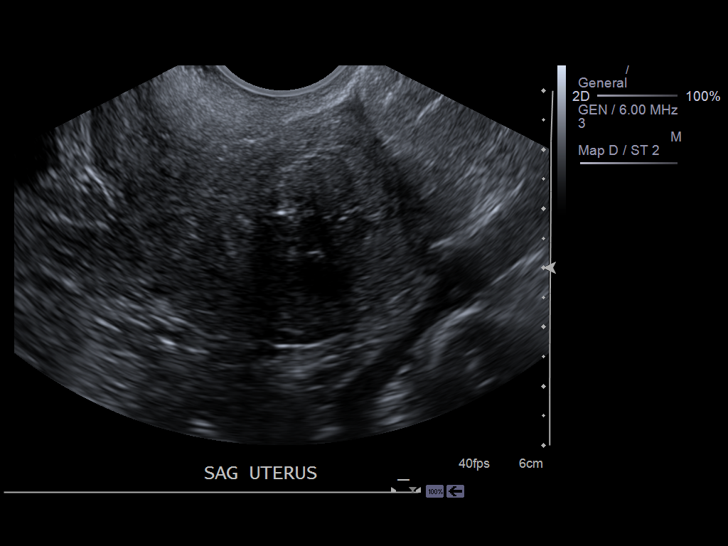
[im 43/79]
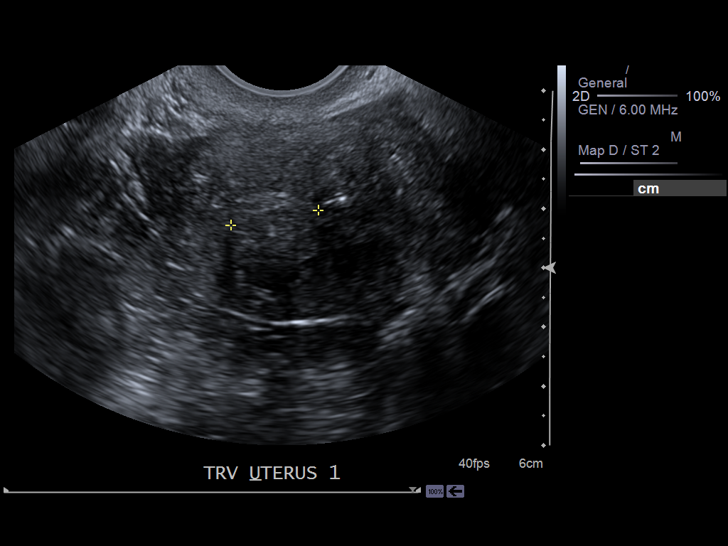
[im 49/79]
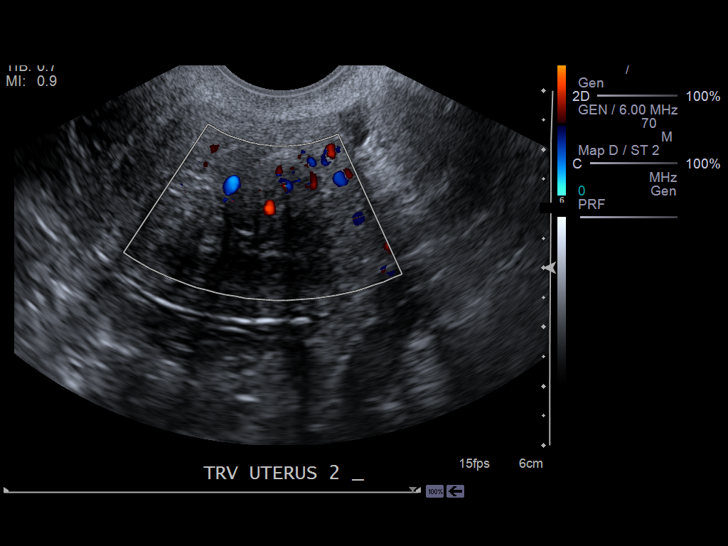
[im 53/79]
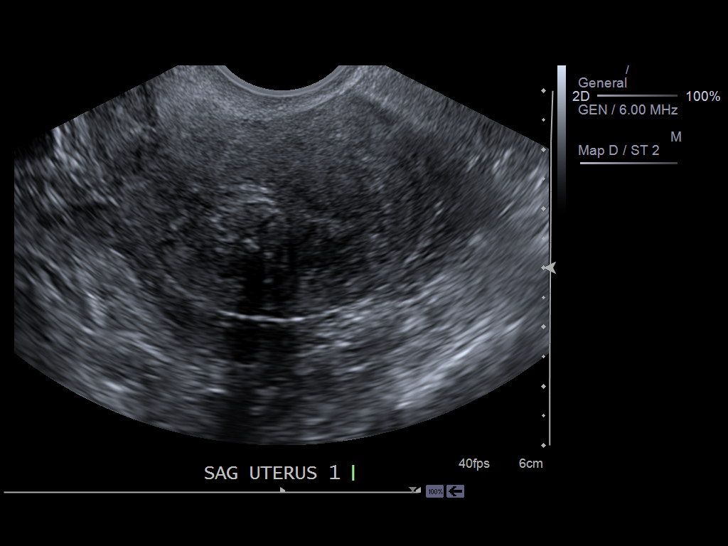
[im 59/79]
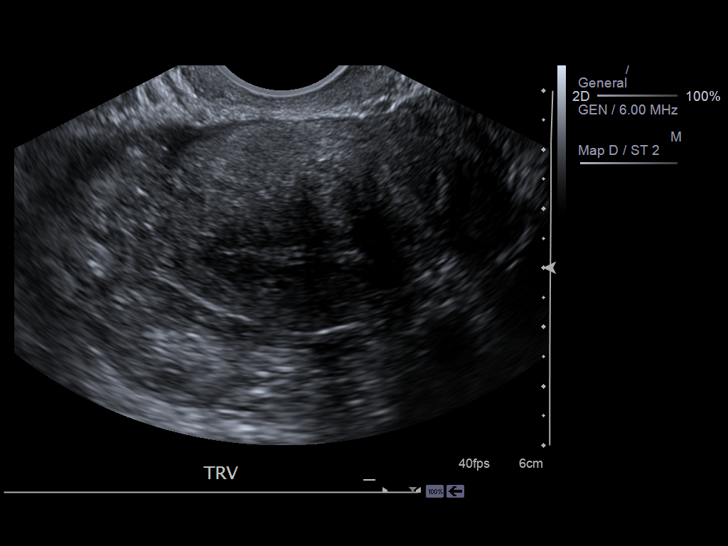
[im 66/79]
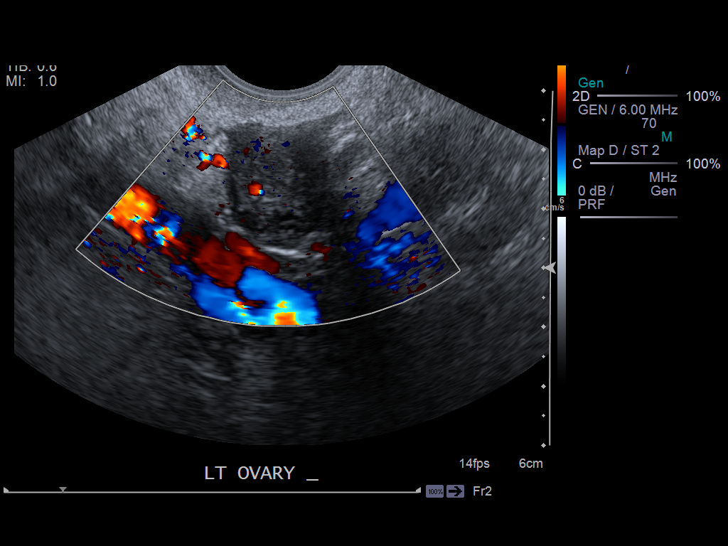
[im 72/79]
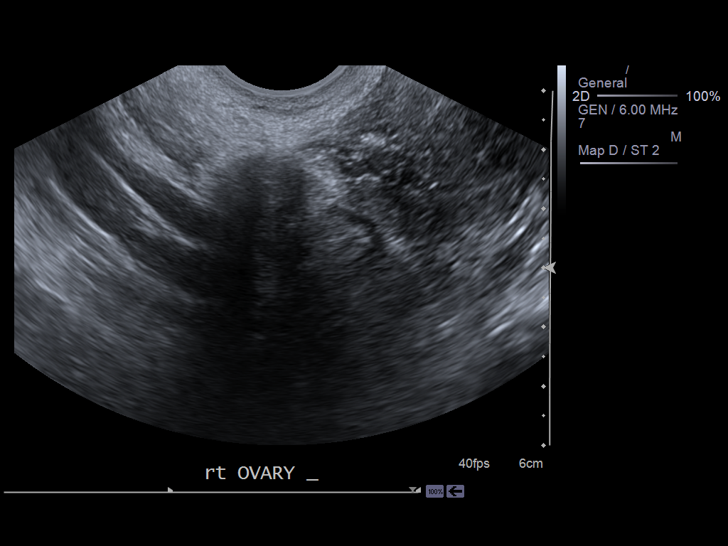
[im 79/79]
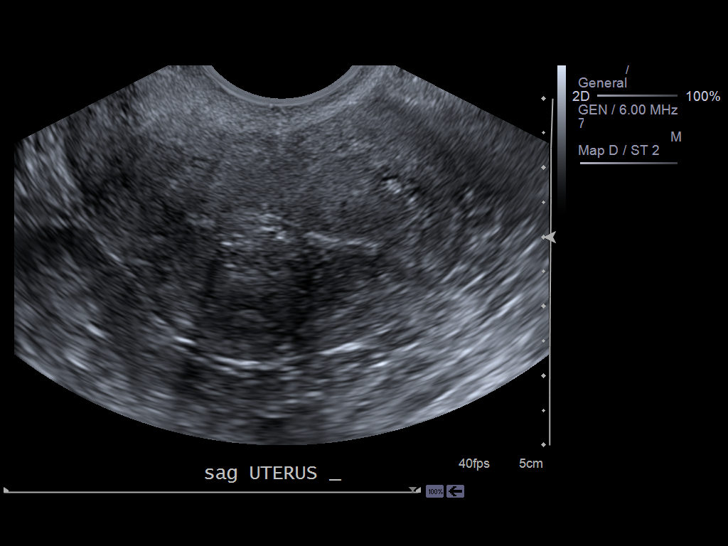

[14 of 25 positions shown; findings below may reference images not displayed]

FINDINGS: The uterus measures 7.62 x 4.61 x 3.51 cm. Two mixed nodules are
appreciated within the fundus of the uterus. These nodules project within
the myometrium adjacent to the endometrium. The largest measures 2.02 x
x 1.85 centimeters. Endometrial thickness is 3.1 millimeters. The ovaries
are unremarkable. Color flow is identified filling the ovarian vessels.
There is no evidence of pelvic fluid nor masses. There is no evidence of
hydronephrosis.
IMPRESSION: 1. Leiomyomata within the uterus as described above.
2. No further sonographic abnormalities.
3. There is no evidence of hydronephrosis.

## 2014-06-10 ENCOUNTER — Ambulatory Visit: Payer: BC Managed Care – PPO | Admitting: Internal Medicine

## 2014-07-10 ENCOUNTER — Ambulatory Visit: Payer: BC Managed Care – PPO | Admitting: Internal Medicine

## 2014-07-29 ENCOUNTER — Telehealth: Payer: Self-pay | Admitting: Internal Medicine

## 2014-07-29 ENCOUNTER — Encounter: Payer: Self-pay | Admitting: Internal Medicine

## 2014-07-29 ENCOUNTER — Ambulatory Visit: Payer: BC Managed Care – PPO | Admitting: Internal Medicine

## 2014-07-29 NOTE — Telephone Encounter (Signed)
Pt job called to stated that the pt is handling an emergency with a student and will be unable to make appt. pt will call back to resch. Please advise to cancel appt/msn

## 2014-12-18 ENCOUNTER — Telehealth: Payer: Self-pay

## 2014-12-18 NOTE — Telephone Encounter (Signed)
Patient called and requested medical information for her state health plan, per patient able to leave a message on the phone at home as she is at work.    Detailed message included, BP HR from most recent visit.  Cholesterol (Good and bad, plus tri) and Blood glucose level from most recent labs.

## 2015-06-12 ENCOUNTER — Other Ambulatory Visit: Payer: Self-pay | Admitting: Orthopedic Surgery

## 2015-07-02 ENCOUNTER — Inpatient Hospital Stay: Admission: RE | Admit: 2015-07-02 | Payer: BC Managed Care – PPO | Source: Ambulatory Visit

## 2015-07-03 ENCOUNTER — Encounter: Payer: Self-pay | Admitting: *Deleted

## 2015-07-03 ENCOUNTER — Encounter
Admission: RE | Admit: 2015-07-03 | Discharge: 2015-07-03 | Disposition: A | Discharge status: Home / Self Care | Payer: Worker's Compensation | Source: Ambulatory Visit | Attending: Orthopedic Surgery | Admitting: Orthopedic Surgery

## 2015-07-03 DIAGNOSIS — Z01812 Encounter for preprocedural laboratory examination: Secondary | ICD-10-CM | POA: Diagnosis present

## 2015-07-03 DIAGNOSIS — Z01810 Encounter for preprocedural cardiovascular examination: Secondary | ICD-10-CM | POA: Insufficient documentation

## 2015-07-03 LAB — BASIC METABOLIC PANEL
ANION GAP: 9 (ref 5–15)
BUN: 14 mg/dL (ref 6–20)
CO2: 26 mmol/L (ref 22–32)
Calcium: 9 mg/dL (ref 8.9–10.3)
Chloride: 106 mmol/L (ref 101–111)
Creatinine, Ser: 0.78 mg/dL (ref 0.44–1.00)
GFR calc Af Amer: >60 mL/min (ref >60)
Glucose, Bld: 132 mg/dL — ABNORMAL HIGH (ref 65–99)
POTASSIUM: 3.8 mmol/L (ref 3.5–5.1)
SODIUM: 141 mmol/L (ref 135–145)

## 2015-07-03 LAB — CBC WITH DIFFERENTIAL/PLATELET
BASOS ABS: 0 10*3/uL (ref 0–0.1)
Basophils Relative: 0 %
Eosinophils Absolute: 0.2 10*3/uL (ref 0–0.7)
Eosinophils Relative: 3 %
HEMATOCRIT: 39.8 % (ref 35.0–47.0)
Hemoglobin: 13.3 g/dL (ref 12.0–16.0)
LYMPHS PCT: 35 %
Lymphs Abs: 2.1 10*3/uL (ref 1.0–3.6)
MCH: 30.5 pg (ref 26.0–34.0)
MCHC: 33.4 g/dL (ref 32.0–36.0)
MCV: 91.3 fL (ref 80.0–100.0)
Monocytes Absolute: 0.3 10*3/uL (ref 0.2–0.9)
Monocytes Relative: 5 %
NEUTROS ABS: 3.4 10*3/uL (ref 1.4–6.5)
Neutrophils Relative %: 57 %
PLATELETS: 234 10*3/uL (ref 150–440)
RBC: 4.36 MIL/uL (ref 3.80–5.20)
RDW: 13 % (ref 11.5–14.5)
WBC: 6 10*3/uL (ref 3.6–11.0)

## 2015-07-03 LAB — PROTIME-INR
INR: 0.97
Prothrombin Time: 13.1 seconds (ref 11.4–15.0)

## 2015-07-03 LAB — APTT: APTT: 33 s (ref 24–36)

## 2015-07-03 NOTE — Patient Instructions (Signed)
  Your procedure is scheduled on: 07-08-15 [facility]) Report to [place] To find out your arrival time please call [phone] between 1PM - 3PM on (TUESDAY)  Remember: Instructions that are not followed completely may result in serious medical risk, up to and including death, or upon the discretion of your surgeon and anesthesiologist your surgery may need to be rescheduled.    _X___ 1. Do not eat food or drink liquids after midnight. No gum chewing or hard candies.     _X___ 2. No Alcohol for 24 hours before or after surgery.   ____ 3. Bring all medications with you on the day of surgery if instructed.    _X___ 4. Notify your doctor if there is any change in your medical condition     (cold, fever, infections).     Do not wear jewelry, make-up, hairpins, clips or nail polish.  Do not wear lotions, powders, or perfumes. You may wear deodorant.  Do not shave 48 hours prior to surgery. Men may shave face and neck.  Do not bring valuables to the hospital.    [facility] is not responsible for any belongings or valuables.               Contacts, dentures or bridgework may not be worn into surgery.  Leave your suitcase in the car. After surgery it may be brought to your room.  For patients admitted to the hospital, discharge time is determined by your treatment team.   Patients discharged the day of surgery will not be allowed to drive home.   Please read over the following fact sheets that you were given:      ____ Take these medicines the morning of surgery with A SIP OF WATER:    1. NONE  2.   3.   4.  5.  6.  ____ Fleet Enema (as directed)   _X___ Use CHG Soap as directed  ____ Use inhalers on the day of surgery  ____ Stop metformin 2 days prior to surgery    ____ Take 1/2 of usual insulin dose the night before surgery and none on the morning of surgery.   ____ Stop Coumadin/Plavix/aspirin-N/A  ____ Stop Anti-inflammatories-NO  NSAIDS OR ASPIRIN PRODUCTS-TYLENOL OK TO TAKE   ____ Stop supplements until after surgery.    ____ Bring C-Pap to the hospital.

## 2015-07-08 ENCOUNTER — Ambulatory Visit
Admission: RE | Admit: 2015-07-08 | Discharge: 2015-07-08 | Disposition: A | Discharge status: Home / Self Care | Payer: Worker's Compensation | Source: Ambulatory Visit | Attending: Orthopedic Surgery | Admitting: Orthopedic Surgery

## 2015-07-08 ENCOUNTER — Encounter: Payer: Self-pay | Admitting: *Deleted

## 2015-07-08 ENCOUNTER — Ambulatory Visit: Payer: Worker's Compensation | Admitting: Anesthesiology

## 2015-07-08 ENCOUNTER — Encounter
Admission: RE | Disposition: A | Discharge status: Home / Self Care | Payer: Self-pay | Source: Ambulatory Visit | Attending: Orthopedic Surgery

## 2015-07-08 DIAGNOSIS — I10 Essential (primary) hypertension: Secondary | ICD-10-CM | POA: Insufficient documentation

## 2015-07-08 DIAGNOSIS — E785 Hyperlipidemia, unspecified: Secondary | ICD-10-CM | POA: Diagnosis not present

## 2015-07-08 DIAGNOSIS — F329 Major depressive disorder, single episode, unspecified: Secondary | ICD-10-CM | POA: Diagnosis not present

## 2015-07-08 DIAGNOSIS — M94269 Chondromalacia, unspecified knee: Secondary | ICD-10-CM | POA: Insufficient documentation

## 2015-07-08 DIAGNOSIS — Z8261 Family history of arthritis: Secondary | ICD-10-CM | POA: Insufficient documentation

## 2015-07-08 DIAGNOSIS — M1990 Unspecified osteoarthritis, unspecified site: Secondary | ICD-10-CM | POA: Diagnosis not present

## 2015-07-08 DIAGNOSIS — Z8249 Family history of ischemic heart disease and other diseases of the circulatory system: Secondary | ICD-10-CM | POA: Insufficient documentation

## 2015-07-08 DIAGNOSIS — M659 Synovitis and tenosynovitis, unspecified: Secondary | ICD-10-CM | POA: Diagnosis not present

## 2015-07-08 DIAGNOSIS — Z809 Family history of malignant neoplasm, unspecified: Secondary | ICD-10-CM | POA: Diagnosis not present

## 2015-07-08 DIAGNOSIS — S83242D Other tear of medial meniscus, current injury, left knee, subsequent encounter: Secondary | ICD-10-CM | POA: Diagnosis not present

## 2015-07-08 DIAGNOSIS — X58XXXD Exposure to other specified factors, subsequent encounter: Secondary | ICD-10-CM | POA: Insufficient documentation

## 2015-07-08 DIAGNOSIS — Z833 Family history of diabetes mellitus: Secondary | ICD-10-CM | POA: Insufficient documentation

## 2015-07-08 DIAGNOSIS — Z79899 Other long term (current) drug therapy: Secondary | ICD-10-CM | POA: Insufficient documentation

## 2015-07-08 HISTORY — DX: Unspecified osteoarthritis, unspecified site: M19.90

## 2015-07-08 HISTORY — PX: KNEE ARTHROSCOPY: SHX127

## 2015-07-08 SURGERY — ARTHROSCOPY, KNEE
Anesthesia: General | Laterality: Left

## 2015-07-08 MED ORDER — ASPIRIN EC 325 MG PO TBEC: 325.0000 mg | DELAYED_RELEASE_TABLET | Freq: Every day | ORAL | Status: DC

## 2015-07-08 MED ORDER — SCOPOLAMINE 1 MG/3DAYS TD PT72
1.0000 | MEDICATED_PATCH | TRANSDERMAL | Status: DC
  Administered 2015-07-08: 1.5 mg via TRANSDERMAL

## 2015-07-08 MED ORDER — FENTANYL CITRATE (PF) 100 MCG/2ML IJ SOLN
25.0000 ug | INTRAMUSCULAR | Status: DC | PRN
  Administered 2015-07-08 (×2): 25 ug via INTRAVENOUS

## 2015-07-08 MED ORDER — BUPIVACAINE-EPINEPHRINE (PF) 0.25% -1:200000 IJ SOLN
INTRAMUSCULAR | Status: AC
  Filled 2015-07-08: qty 30

## 2015-07-08 MED ORDER — DEXAMETHASONE SODIUM PHOSPHATE 10 MG/ML IJ SOLN
INTRAMUSCULAR | Status: DC | PRN
  Administered 2015-07-08: 10 mg via INTRAVENOUS

## 2015-07-08 MED ORDER — LIDOCAINE HCL (CARDIAC) 20 MG/ML IV SOLN
INTRAVENOUS | Status: DC | PRN
  Administered 2015-07-08: 50 mg via INTRAVENOUS

## 2015-07-08 MED ORDER — FENTANYL CITRATE (PF) 100 MCG/2ML IJ SOLN
INTRAMUSCULAR | Status: AC
  Administered 2015-07-08: 25 ug via INTRAVENOUS
  Filled 2015-07-08: qty 2

## 2015-07-08 MED ORDER — HYDROCODONE-ACETAMINOPHEN 5-325 MG PO TABS: 1.0000 | ORAL_TABLET | Freq: Four times a day (QID) | ORAL | Status: DC | PRN

## 2015-07-08 MED ORDER — PROPOFOL 10 MG/ML IV BOLUS
INTRAVENOUS | Status: DC | PRN
  Administered 2015-07-08: 160 mg via INTRAVENOUS

## 2015-07-08 MED ORDER — FAMOTIDINE 20 MG PO TABS
ORAL_TABLET | ORAL | Status: AC
  Filled 2015-07-08: qty 1

## 2015-07-08 MED ORDER — ONDANSETRON HCL 4 MG/2ML IJ SOLN
INTRAMUSCULAR | Status: DC | PRN
  Administered 2015-07-08: 4 mg via INTRAVENOUS

## 2015-07-08 MED ORDER — LABETALOL HCL 5 MG/ML IV SOLN
10.0000 mg | Freq: Once | INTRAVENOUS | Status: AC
  Administered 2015-07-08: 10 mg via INTRAVENOUS

## 2015-07-08 MED ORDER — LABETALOL HCL 5 MG/ML IV SOLN
10.0000 mg | Freq: Once | INTRAVENOUS | Status: AC
Start: 2015-07-08 — End: 2015-07-08
  Administered 2015-07-08: 10 mg via INTRAVENOUS

## 2015-07-08 MED ORDER — SCOPOLAMINE 1 MG/3DAYS TD PT72
MEDICATED_PATCH | TRANSDERMAL | Status: AC
  Administered 2015-07-08: 1.5 mg via TRANSDERMAL
  Filled 2015-07-08: qty 1

## 2015-07-08 MED ORDER — CEFAZOLIN SODIUM-DEXTROSE 2-4 GM/100ML-% IV SOLN
2.0000 g | INTRAVENOUS | Status: AC
  Administered 2015-07-08: 2 g via INTRAVENOUS

## 2015-07-08 MED ORDER — LIDOCAINE HCL 1 % IJ SOLN
INTRAMUSCULAR | Status: DC | PRN
  Administered 2015-07-08: 12 mL

## 2015-07-08 MED ORDER — BUPIVACAINE-EPINEPHRINE 0.25% -1:200000 IJ SOLN
INTRAMUSCULAR | Status: DC | PRN
  Administered 2015-07-08: 30 mL

## 2015-07-08 MED ORDER — FAMOTIDINE 20 MG PO TABS
20.0000 mg | ORAL_TABLET | Freq: Once | ORAL | Status: AC
  Administered 2015-07-08: 20 mg via ORAL

## 2015-07-08 MED ORDER — CEFAZOLIN SODIUM-DEXTROSE 2-4 GM/100ML-% IV SOLN
INTRAVENOUS | Status: AC
  Filled 2015-07-08: qty 100

## 2015-07-08 MED ORDER — LIDOCAINE HCL (PF) 1 % IJ SOLN
INTRAMUSCULAR | Status: AC
  Filled 2015-07-08: qty 30

## 2015-07-08 MED ORDER — ONDANSETRON HCL 4 MG/2ML IJ SOLN: 4.0000 mg | Freq: Once | INTRAMUSCULAR | Status: DC | PRN

## 2015-07-08 MED ORDER — EPHEDRINE SULFATE 50 MG/ML IJ SOLN
INTRAMUSCULAR | Status: DC | PRN
  Administered 2015-07-08: 10 mg via INTRAVENOUS

## 2015-07-08 MED ORDER — MORPHINE SULFATE (PF) 2 MG/ML IV SOLN
INTRAVENOUS | Status: AC
  Filled 2015-07-08: qty 1

## 2015-07-08 MED ORDER — MORPHINE SULFATE (PF) 2 MG/ML IV SOLN: 2.0000 mg | Freq: Once | INTRAVENOUS | Status: DC

## 2015-07-08 MED ORDER — MIDAZOLAM HCL 2 MG/2ML IJ SOLN
INTRAMUSCULAR | Status: DC | PRN
  Administered 2015-07-08: 2 mg via INTRAVENOUS

## 2015-07-08 MED ORDER — FENTANYL CITRATE (PF) 100 MCG/2ML IJ SOLN
INTRAMUSCULAR | Status: DC | PRN
  Administered 2015-07-08: 25 ug via INTRAVENOUS
  Administered 2015-07-08: 50 ug via INTRAVENOUS
  Administered 2015-07-08: 25 ug via INTRAVENOUS

## 2015-07-08 MED ORDER — LACTATED RINGERS IV SOLN
INTRAVENOUS | Status: DC
  Administered 2015-07-08 (×3): via INTRAVENOUS

## 2015-07-08 MED ORDER — LABETALOL HCL 5 MG/ML IV SOLN
INTRAVENOUS | Status: AC
  Administered 2015-07-08: 10 mg via INTRAVENOUS
  Filled 2015-07-08: qty 4

## 2015-07-08 SURGICAL SUPPLY — 33 items
BUR RADIUS 3.5 (BURR) IMPLANT
BUR RADIUS 4.0X18.5 (BURR) ×3 IMPLANT
CLOSURE WOUND 1/2 X4 (GAUZE/BANDAGES/DRESSINGS) ×1
COOLER POLAR GLACIER W/PUMP (MISCELLANEOUS) IMPLANT
DRAPE IMP U-DRAPE 54X76 (DRAPES) ×3 IMPLANT
DURAPREP 26ML APPLICATOR (WOUND CARE) ×3 IMPLANT
GAUZE PETRO XEROFOAM 1X8 (MISCELLANEOUS) ×3 IMPLANT
GAUZE SPONGE 4X4 12PLY STRL (GAUZE/BANDAGES/DRESSINGS) ×3 IMPLANT
GLOVE BIOGEL PI IND STRL 9 (GLOVE) ×1 IMPLANT
GLOVE BIOGEL PI INDICATOR 9 (GLOVE) ×2
GLOVE SURG 9.0 ORTHO LTXF (GLOVE) ×6 IMPLANT
GOWN STRL REUS TWL 2XL XL LVL4 (GOWN DISPOSABLE) ×3 IMPLANT
GOWN STRL REUS W/ TWL LRG LVL3 (GOWN DISPOSABLE) ×1 IMPLANT
GOWN STRL REUS W/TWL LRG LVL3 (GOWN DISPOSABLE) ×2
IV LACTATED RINGER IRRG 3000ML (IV SOLUTION) ×8
IV LR IRRIG 3000ML ARTHROMATIC (IV SOLUTION) ×4 IMPLANT
KIT RM TURNOVER STRD PROC AR (KITS) ×3 IMPLANT
MANIFOLD NEPTUNE II (INSTRUMENTS) ×3 IMPLANT
PACK ARTHROSCOPY KNEE (MISCELLANEOUS) ×3 IMPLANT
PAD ABD DERMACEA PRESS 5X9 (GAUZE/BANDAGES/DRESSINGS) ×6 IMPLANT
PAD WRAPON POLAR KNEE (MISCELLANEOUS) ×1 IMPLANT
SET TUBE SUCT SHAVER OUTFL 24K (TUBING) ×3 IMPLANT
SET TUBE TIP INTRA-ARTICULAR (MISCELLANEOUS) ×3 IMPLANT
SOL PREP PVP 2OZ (MISCELLANEOUS)
SOLUTION PREP PVP 2OZ (MISCELLANEOUS) IMPLANT
STRIP CLOSURE SKIN 1/2X4 (GAUZE/BANDAGES/DRESSINGS) ×2 IMPLANT
SUT ETHILON 4-0 (SUTURE) ×2
SUT ETHILON 4-0 FS2 18XMFL BLK (SUTURE) ×1
SUTURE ETHLN 4-0 FS2 18XMF BLK (SUTURE) ×1 IMPLANT
TUBING ARTHRO INFLOW-ONLY STRL (TUBING) ×3 IMPLANT
WAND HAND CNTRL MULTIVAC 50 (MISCELLANEOUS) IMPLANT
WAND HAND CNTRL MULTIVAC 90 (MISCELLANEOUS) ×3 IMPLANT
WRAPON POLAR PAD KNEE (MISCELLANEOUS) ×3

## 2015-07-08 NOTE — Progress Notes (Signed)
Pt states she is very dizzy and has a bad headache    Applied nasal cannula   Put pt in flat position and turned lights out   Dr Kayleen Memos called about elevated blood pressure but blood pressure now 127/89   Heart rate 79  No more fentanyl for awhile

## 2015-07-08 NOTE — Op Note (Signed)
  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  SURGEON:  Thornton Park, MD  ANESTHESIA:   General  PREOPERATIVE INDICATIONS:  Angela Hogan  65 y.o. 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    The risks benefits and alternatives were discussed with the patient preoperatively including the risks of infection, bleeding, nerve injury, knee stiffness, persistent pain, osteoarthritis and the need for further surgery. Medical  risks include DVT and pulmonary embolism, myocardial infarction, stroke, pneumonia, respiratory failure and death. The patient understood these risks and wished to proceed.   OPERATIVE FINDINGS: Medial meniscus tear involving the body and posterior horn with unstable fragments, grade 3 lesion of the trochlea measuring approximately 8 x 8 mm, inflamed and enlarged medial plica, diffuse synovitis  OPERATIVE PROCEDURE: Patient was met in the preoperative area. The left extremity was signed with the word yes and my initials according the hospital's correct site of surgery protocol.  The patient was brought to the operating room where she was placed supine on the operative table. General anesthesia was administered. The patient was prepped and draped in a sterile fashion.  A timeout was performed to verify the patient's name, date of birth, medical record number, correct site of surgery correct procedure to be performed. It was also used to verify the patient had had received antibiotics that all appropriate instruments, and radiographic studies were available in the room.  Once all in attendance were in agreement, the case began.  Proposed arthroscopy incisions were drawn out with a surgical marker. These were pre-injected with 1% lidocaine plain. An 11 blade was used to establish an inferior lateral and inferomedial portals. The inferomedial portal was created using a 18-gauge spinal needle under direct visualization.  A full diagnostic examination of the knee was performed including the suprapatellar pouch, patellofemoral joint, medial lateral compartments as well as the medial lateral gutters, the intercondylar notch in the posterior knee.  Patient had her medial meniscal tear treated with a 4.0 resector shaver blade and straight duckbill basket. The meniscus was debrided until a stable rim was achieved.  A partial synovectomy and debridement of the medial plica was also performed using a 4.0 resector shaver blade and 90 ArthroCare wand. The knee was then copiously lavaged.  All arthroscopic incisions were then removed. The two arthroscopy portals were closed with 4-0 nylon. Steri-Strips were applied along with a dry sterile and compressive dressing. The patient was brought to the PACU in stable condition. I scrubbed and present the entire case and all sharp and instrument counts were correct at the conclusion the case. I spoke to the patient's husband postoperatively to let him know the case it done without complication and the patient was stable in the recovery room.  Timoteo Gaul, MD

## 2015-07-08 NOTE — Anesthesia Preprocedure Evaluation (Addendum)
Anesthesia Evaluation  Patient identified by MRN, date of birth, ID band Patient awake    Reviewed: Allergy & Precautions, H&P , NPO status , Patient's Chart, lab work & pertinent test results, reviewed documented beta blocker date and time   History of Anesthesia Complications (+) PONV and history of anesthetic complications  Airway Mallampati: III  TM Distance: >3 FB Neck ROM: full    Dental no notable dental hx. (+) Implants, Caps, Teeth Intact   Pulmonary neg pulmonary ROS,    Pulmonary exam normal breath sounds clear to auscultation       Cardiovascular Exercise Tolerance: Good hypertension (no longer on meds), (-) angina(-) CAD, (-) Past MI, (-) Cardiac Stents and (-) CABG Normal cardiovascular exam(-) dysrhythmias (-) Valvular Problems/Murmurs Rhythm:regular Rate:Normal     Neuro/Psych negative neurological ROS  negative psych ROS   GI/Hepatic negative GI ROS, Neg liver ROS,   Endo/Other  negative endocrine ROS  Renal/GU negative Renal ROS  negative genitourinary   Musculoskeletal   Abdominal   Peds  Hematology negative hematology ROS (+)   Anesthesia Other Findings Past Medical History:   Depression                                                   Hyperlipidemia                                               Arthritis                                                    Hypertension                                                   Comment:h/o-pcp took pt off meds 1 year ago due to               controlled bp   Reproductive/Obstetrics negative OB ROS                            Anesthesia Physical Anesthesia Plan  ASA: II  Anesthesia Plan: General   Post-op Pain Management:    Induction:   Airway Management Planned:   Additional Equipment:   Intra-op Plan:   Post-operative Plan:   Informed Consent: I have reviewed the patients History and Physical, chart, labs and  discussed the procedure including the risks, benefits and alternatives for the proposed anesthesia with the patient or authorized representative who has indicated his/her understanding and acceptance.   Dental Advisory Given  Plan Discussed with: Anesthesiologist, CRNA and Surgeon  Anesthesia Plan Comments:         Anesthesia Quick Evaluation

## 2015-07-08 NOTE — Progress Notes (Signed)
Feeling better now again  Blood pressure 125/94 after labetalol 20mg    Room air sat 95

## 2015-07-08 NOTE — Progress Notes (Signed)
Dr Kayleen Memos in to check pt  Labetalol given as ordered   No morphine given at this pt  Pt states her head is spinning  No nausea

## 2015-07-08 NOTE — Progress Notes (Signed)
Took patch off behind ear

## 2015-07-08 NOTE — H&P (Signed)
The patient has been re-examined, and the chart reviewed, and there have been no interval changes to the documented history and physical.    The risks, benefits, and alternatives have been discussed at length, and the patient is willing to proceed.   

## 2015-07-08 NOTE — Progress Notes (Signed)
Feeling a little better  Not as dizzy

## 2015-07-08 NOTE — Anesthesia Procedure Notes (Signed)
Procedure Name: LMA Insertion Date/Time: 07/08/2015 2:12 PM Performed by: [id number], Teale Goodgame Pre-anesthesia Checklist: Patient identified, Emergency Drugs available, Suction available, Patient being monitored and Timeout performed Patient Re-evaluated:Patient Re-evaluated prior to inductionOxygen Delivery Method: Circle system utilized Preoxygenation: Pre-oxygenation with 100% oxygen Intubation Type: IV induction LMA: LMA inserted LMA Size: 3.0 Number of attempts: 1

## 2015-07-08 NOTE — Progress Notes (Signed)
Spoke with Dr. Kayleen Memos , bp 120/91 , okay to d/c home

## 2015-07-08 NOTE — Progress Notes (Signed)
Blood pressure back up to 135/97  Another labetalol 10mg  given   Headache back

## 2015-07-08 NOTE — Transfer of Care (Signed)
Immediate Anesthesia Transfer of Care Note  Patient: Angela Hogan  Procedure(s) Performed: Procedure(s): ARTHROSCOPY KNEE (Left)  Patient Location: PACU  Anesthesia Type:General  Level of Consciousness: awake, alert , oriented and patient cooperative  Airway & Oxygen Therapy: Patient Spontanous Breathing and Patient connected to face mask oxygen  Post-op Assessment: Report given to RN, Post -op Vital signs reviewed and stable and Patient moving all extremities X 4  Post vital signs: Reviewed and stable  Last Vitals:  Filed Vitals:   07/08/15 1252  BP: 142/90  Pulse: 68  Temp: 36.4 C  Resp: 16    Last Pain: There were no vitals filed for this visit.       Complications: No apparent anesthesia complications

## 2015-07-08 NOTE — Progress Notes (Signed)
Blood pressure 127/98 dr Kayleen Memos called states that she could go next door

## 2015-07-09 ENCOUNTER — Encounter: Payer: Self-pay | Admitting: Orthopedic Surgery

## 2015-07-09 NOTE — Anesthesia Postprocedure Evaluation (Signed)
Anesthesia Post Note  Patient: Angela Hogan  Procedure(s) Performed: Procedure(s) (LRB): ARTHROSCOPY KNEE (Left)  Patient location during evaluation: PACU Anesthesia Type: General Level of consciousness: awake and alert Pain management: pain level controlled Vital Signs Assessment: post-procedure vital signs reviewed and stable Respiratory status: spontaneous breathing, nonlabored ventilation, respiratory function stable and patient connected to nasal cannula oxygen Cardiovascular status: blood pressure returned to baseline and stable Postop Assessment: no signs of nausea or vomiting Anesthetic complications: no    Last Vitals:  Filed Vitals:   07/08/15 1741 07/08/15 1802  BP: 133/94 120/91  Pulse: 75 69  Temp: 36.1 C   Resp: 16 16    Last Pain:  Filed Vitals:   07/08/15 1803  PainSc: 0-No pain                 Martha Clan

## 2015-12-16 ENCOUNTER — Telehealth: Payer: Self-pay | Admitting: Internal Medicine

## 2015-12-16 NOTE — Telephone Encounter (Signed)
Pt scheduled for 10/9 @ 11am.

## 2015-12-16 NOTE — Telephone Encounter (Signed)
Pt called and stated that she is a former Dr. Gilford Rile patient and would like to establish with M. Arnett, she needs to come in for a tb/ppd shot and a physical. She is starting a new job and needs this completed by 10/23. Where could we scheduled her? Please advise, thank you!  Call pt @ [phone] (leave message)

## 2015-12-16 NOTE — Telephone Encounter (Signed)
You can place her in any 30 minute slot.

## 2015-12-21 ENCOUNTER — Encounter: Payer: Self-pay | Admitting: Family

## 2015-12-21 ENCOUNTER — Ambulatory Visit (INDEPENDENT_AMBULATORY_CARE_PROVIDER_SITE_OTHER): Payer: BC Managed Care – PPO | Admitting: Family

## 2015-12-21 VITALS — BP 132/80 | HR 89 | Temp 97.0°F | Ht 66.0 in | Wt 202.8 lb

## 2015-12-21 DIAGNOSIS — Z0000 Encounter for general adult medical examination without abnormal findings: Secondary | ICD-10-CM

## 2015-12-21 MED ORDER — TETANUS-DIPHTH-ACELL PERTUSSIS 5-2.5-18.5 LF-MCG/0.5 IM SUSP: 0.5000 mL | Freq: Once | INTRAMUSCULAR | 0 refills | Status: AC

## 2015-12-21 NOTE — Assessment & Plan Note (Addendum)
Patient is due for colonoscopy in this order today. She is also due for mammogram. Patient will schedule this. up-to-date with Pap smear done last year and negative for malignancy. She follows with GYN in [place]. She also states she had a full workup when she was concern for ovarian cancer and that was normal. She will get me those records.No increased fracture risk, defer DEXA screening at this time. Also deferred pelvic exam as this is done with GYN in [place]. She is due for tetanus, and I gave her prescription for this. Screening labs ordered including hepatitis C and HIV. Encouraged exercise.

## 2015-12-21 NOTE — Patient Instructions (Addendum)
Lab appointment for fasting labs. Follow-up in 3 months.  Health Maintenance, Female Adopting a healthy lifestyle and getting preventive care can go a long way to promote health and wellness. Talk with your health care provider about what schedule of regular examinations is right for you. This is a good chance for you to check in with your provider about disease prevention and staying healthy. In between checkups, there are plenty of things you can do on your own. Experts have done a lot of research about which lifestyle changes and preventive measures are most likely to keep you healthy. Ask your health care provider for more information. WEIGHT AND DIET  Eat a healthy diet  Be sure to include plenty of vegetables, fruits, low-fat dairy products, and lean protein.  Do not eat a lot of foods high in solid fats, added sugars, or salt.  Get regular exercise. This is one of the most important things you can do for your health.  Most adults should exercise for at least 150 minutes each week. The exercise should increase your heart rate and make you sweat (moderate-intensity exercise).  Most adults should also do strengthening exercises at least twice a week. This is in addition to the moderate-intensity exercise.  Maintain a healthy weight  Body mass index (BMI) is a measurement that can be used to identify possible weight problems. It estimates body fat based on height and weight. Your health care provider can help determine your BMI and help you achieve or maintain a healthy weight.  For females 85 years of age and older:   A BMI below 18.5 is considered underweight.  A BMI of 18.5 to 24.9 is normal.  A BMI of 25 to 29.9 is considered overweight.  A BMI of 30 and above is considered obese.  Watch levels of cholesterol and blood lipids  You should start having your blood tested for lipids and cholesterol at 63 years of age, then have this test every 5 years.  You may need to have  your cholesterol levels checked more often if:  Your lipid or cholesterol levels are high.  You are older than 63 years of age.  You are at high risk for heart disease.  CANCER SCREENING   Lung Cancer  Lung cancer screening is recommended for adults 91-22 years old who are at high risk for lung cancer because of a history of smoking.  A yearly low-dose CT scan of the lungs is recommended for people who:  Currently smoke.  Have quit within the past 15 years.  Have at least a 30-pack-year history of smoking. A pack year is smoking an average of one pack of cigarettes a day for 1 year.  Yearly screening should continue until it has been 15 years since you quit.  Yearly screening should stop if you develop a health problem that would prevent you from having lung cancer treatment.  Breast Cancer  Practice breast self-awareness. This means understanding how your breasts normally appear and feel.  It also means doing regular breast self-exams. Let your health care provider know about any changes, no matter how small.  If you are in your 20s or 30s, you should have a clinical breast exam (CBE) by a health care provider every 1-3 years as part of a regular health exam.  If you are 24 or older, have a CBE every year. Also consider having a breast X-ray (mammogram) every year.  If you have a family history of breast cancer, talk to  your health care provider about genetic screening.  If you are at high risk for breast cancer, talk to your health care provider about having an MRI and a mammogram every year.  Breast cancer gene (BRCA) assessment is recommended for women who have family members with BRCA-related cancers. BRCA-related cancers include:  Breast.  Ovarian.  Tubal.  Peritoneal cancers.  Results of the assessment will determine the need for genetic counseling and BRCA1 and BRCA2 testing. Cervical Cancer Your health care provider may recommend that you be screened  regularly for cancer of the pelvic organs (ovaries, uterus, and vagina). This screening involves a pelvic examination, including checking for microscopic changes to the surface of your cervix (Pap test). You may be encouraged to have this screening done every 3 years, beginning at age 61.  For women ages 51-65, health care providers may recommend pelvic exams and Pap testing every 3 years, or they may recommend the Pap and pelvic exam, combined with testing for human papilloma virus (HPV), every 5 years. Some types of HPV increase your risk of cervical cancer. Testing for HPV may also be done on women of any age with unclear Pap test results.  Other health care providers may not recommend any screening for nonpregnant women who are considered low risk for pelvic cancer and who do not have symptoms. Ask your health care provider if a screening pelvic exam is right for you.  If you have had past treatment for cervical cancer or a condition that could lead to cancer, you need Pap tests and screening for cancer for at least 20 years after your treatment. If Pap tests have been discontinued, your risk factors (such as having a new sexual partner) need to be reassessed to determine if screening should resume. Some women have medical problems that increase the chance of getting cervical cancer. In these cases, your health care provider may recommend more frequent screening and Pap tests. Colorectal Cancer  This type of cancer can be detected and often prevented.  Routine colorectal cancer screening usually begins at 64 years of age and continues through 63 years of age.  Your health care provider may recommend screening at an earlier age if you have risk factors for colon cancer.  Your health care provider may also recommend using home test kits to check for hidden blood in the stool.  A small camera at the end of a tube can be used to examine your colon directly (sigmoidoscopy or colonoscopy). This is  done to check for the earliest forms of colorectal cancer.  Routine screening usually begins at age 66.  Direct examination of the colon should be repeated every 5-10 years through 63 years of age. However, you may need to be screened more often if early forms of precancerous polyps or small growths are found. Skin Cancer  Check your skin from head to toe regularly.  Tell your health care provider about any new moles or changes in moles, especially if there is a change in a mole's shape or color.  Also tell your health care provider if you have a mole that is larger than the size of a pencil eraser.  Always use sunscreen. Apply sunscreen liberally and repeatedly throughout the day.  Protect yourself by wearing long sleeves, pants, a wide-brimmed hat, and sunglasses whenever you are outside. HEART DISEASE, DIABETES, AND HIGH BLOOD PRESSURE   High blood pressure causes heart disease and increases the risk of stroke. High blood pressure is more likely to develop in:  People who have blood pressure in the high end of the normal range (130-139/85-89 mm Hg).  People who are overweight or obese.  People who are African American.  If you are 18-39 years of age, have your blood pressure checked every 3-5 years. If you are 40 years of age or older, have your blood pressure checked every year. You should have your blood pressure measured twice--once when you are at a hospital or clinic, and once when you are not at a hospital or clinic. Record the average of the two measurements. To check your blood pressure when you are not at a hospital or clinic, you can use:  An automated blood pressure machine at a pharmacy.  A home blood pressure monitor.  If you are between 55 years and 79 years old, ask your health care provider if you should take aspirin to prevent strokes.  Have regular diabetes screenings. This involves taking a blood sample to check your fasting blood sugar level.  If you are at  a normal weight and have a low risk for diabetes, have this test once every three years after 63 years of age.  If you are overweight and have a high risk for diabetes, consider being tested at a younger age or more often. PREVENTING INFECTION  Hepatitis B  If you have a higher risk for hepatitis B, you should be screened for this virus. You are considered at high risk for hepatitis B if:  You were born in a country where hepatitis B is common. Ask your health care provider which countries are considered high risk.  Your parents were born in a high-risk country, and you have not been immunized against hepatitis B (hepatitis B vaccine).  You have HIV or AIDS.  You use needles to inject street drugs.  You live with someone who has hepatitis B.  You have had sex with someone who has hepatitis B.  You get hemodialysis treatment.  You take certain medicines for conditions, including cancer, organ transplantation, and autoimmune conditions. Hepatitis C  Blood testing is recommended for:  Everyone born from 1945 through 1965.  Anyone with known risk factors for hepatitis C. Sexually transmitted infections (STIs)  You should be screened for sexually transmitted infections (STIs) including gonorrhea and chlamydia if:  You are sexually active and are younger than 63 years of age.  You are older than 63 years of age and your health care provider tells you that you are at risk for this type of infection.  Your sexual activity has changed since you were last screened and you are at an increased risk for chlamydia or gonorrhea. Ask your health care provider if you are at risk.  If you do not have HIV, but are at risk, it may be recommended that you take a prescription medicine daily to prevent HIV infection. This is called pre-exposure prophylaxis (PrEP). You are considered at risk if:  You are sexually active and do not regularly use condoms or know the HIV status of your  partner(s).  You take drugs by injection.  You are sexually active with a partner who has HIV. Talk with your health care provider about whether you are at high risk of being infected with HIV. If you choose to begin PrEP, you should first be tested for HIV. You should then be tested every 3 months for as long as you are taking PrEP.  PREGNANCY   If you are premenopausal and you may become pregnant, ask your health   care provider about preconception counseling.  If you may become pregnant, take 400 to 800 micrograms (mcg) of folic acid every day.  If you want to prevent pregnancy, talk to your health care provider about birth control (contraception). OSTEOPOROSIS AND MENOPAUSE   Osteoporosis is a disease in which the bones lose minerals and strength with aging. This can result in serious bone fractures. Your risk for osteoporosis can be identified using a bone density scan.  If you are 65 years of age or older, or if you are at risk for osteoporosis and fractures, ask your health care provider if you should be screened.  Ask your health care provider whether you should take a calcium or vitamin D supplement to lower your risk for osteoporosis.  Menopause may have certain physical symptoms and risks.  Hormone replacement therapy may reduce some of these symptoms and risks. Talk to your health care provider about whether hormone replacement therapy is right for you.  HOME CARE INSTRUCTIONS   Schedule regular health, dental, and eye exams.  Stay current with your immunizations.   Do not use any tobacco products including cigarettes, chewing tobacco, or electronic cigarettes.  If you are pregnant, do not drink alcohol.  If you are breastfeeding, limit how much and how often you drink alcohol.  Limit alcohol intake to no more than 1 drink per day for nonpregnant women. One drink equals 12 ounces of beer, 5 ounces of wine, or 1 ounces of hard liquor.  Do not use street drugs.  Do  not share needles.  Ask your health care provider for help if you need support or information about quitting drugs.  Tell your health care provider if you often feel depressed.  Tell your health care provider if you have ever been abused or do not feel safe at home.   This information is not intended to replace advice given to you by your health care provider. Make sure you discuss any questions you have with your health care provider.   Document Released: 09/13/2010 Document Revised: 03/21/2014 Document Reviewed: 01/30/2013 Elsevier Interactive Patient Education 2016 Elsevier Inc.  
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
## 2015-12-21 NOTE — Progress Notes (Signed)
Pre visit review using our clinic review tool, if applicable. No additional management support is needed unless otherwise documented below in the visit note. 

## 2015-12-22 ENCOUNTER — Ambulatory Visit: Payer: Self-pay

## 2015-12-23 ENCOUNTER — Ambulatory Visit: Payer: BC Managed Care – PPO

## 2015-12-23 ENCOUNTER — Other Ambulatory Visit (INDEPENDENT_AMBULATORY_CARE_PROVIDER_SITE_OTHER): Payer: BC Managed Care – PPO

## 2015-12-23 DIAGNOSIS — Z111 Encounter for screening for respiratory tuberculosis: Secondary | ICD-10-CM

## 2015-12-23 DIAGNOSIS — Z0000 Encounter for general adult medical examination without abnormal findings: Secondary | ICD-10-CM

## 2015-12-23 LAB — CBC WITH DIFFERENTIAL/PLATELET
BASOS PCT: 0.3 % (ref 0.0–3.0)
Basophils Absolute: 0 10*3/uL (ref 0.0–0.1)
EOS ABS: 0.2 10*3/uL (ref 0.0–0.7)
EOS PCT: 2.3 % (ref 0.0–5.0)
HCT: 42.7 % (ref 36.0–46.0)
HEMOGLOBIN: 14.6 g/dL (ref 12.0–15.0)
LYMPHS ABS: 2.3 10*3/uL (ref 0.7–4.0)
Lymphocytes Relative: 32.2 % (ref 12.0–46.0)
MCHC: 34.2 g/dL (ref 30.0–36.0)
MCV: 89.4 fl (ref 78.0–100.0)
MONO ABS: 0.3 10*3/uL (ref 0.1–1.0)
Monocytes Relative: 4.9 % (ref 3.0–12.0)
NEUTROS ABS: 4.2 10*3/uL (ref 1.4–7.7)
Neutrophils Relative %: 60.3 % (ref 43.0–77.0)
PLATELETS: 300 10*3/uL (ref 150.0–400.0)
RBC: 4.78 Mil/uL (ref 3.87–5.11)
RDW: 13.1 % (ref 11.5–15.5)
WBC: 7 10*3/uL (ref 4.0–10.5)

## 2015-12-23 LAB — COMPREHENSIVE METABOLIC PANEL
ALBUMIN: 4.2 g/dL (ref 3.5–5.2)
ALT: 22 U/L (ref 0–35)
AST: 15 U/L (ref 0–37)
Alkaline Phosphatase: 61 U/L (ref 39–117)
BUN: 16 mg/dL (ref 6–23)
CHLORIDE: 105 meq/L (ref 96–112)
CO2: 26 meq/L (ref 19–32)
Calcium: 9.8 mg/dL (ref 8.4–10.5)
Creatinine, Ser: 0.77 mg/dL (ref 0.40–1.20)
GFR: 80.45 mL/min (ref >60.00)
Glucose, Bld: 101 mg/dL — ABNORMAL HIGH (ref 70–99)
POTASSIUM: 4 meq/L (ref 3.5–5.1)
SODIUM: 140 meq/L (ref 135–145)
Total Bilirubin: 0.5 mg/dL (ref 0.2–1.2)
Total Protein: 7.5 g/dL (ref 6.0–8.3)

## 2015-12-23 LAB — LIPID PANEL
CHOLESTEROL: 248 mg/dL — AB (ref 0–200)
HDL: 58.7 mg/dL (ref >39.00)
LDL Cholesterol: 153 mg/dL — ABNORMAL HIGH (ref 0–99)
NONHDL: 189.06
Total CHOL/HDL Ratio: 4
Triglycerides: 182 mg/dL — ABNORMAL HIGH (ref 0.0–149.0)
VLDL: 36.4 mg/dL (ref 0.0–40.0)

## 2015-12-23 LAB — TB SKIN TEST
Induration: 0 mm
TB Skin Test: NEGATIVE

## 2015-12-23 LAB — TSH: TSH: 0.94 u[IU]/mL (ref 0.35–4.50)

## 2015-12-23 LAB — HEMOGLOBIN A1C: HEMOGLOBIN A1C: 6 % (ref 4.6–6.5)

## 2015-12-23 LAB — VITAMIN D 25 HYDROXY (VIT D DEFICIENCY, FRACTURES): VITD: 21.29 ng/mL — ABNORMAL LOW (ref 30.00–100.00)

## 2015-12-23 NOTE — Progress Notes (Signed)
Patient came back for PPD reading, see results for details, thanks

## 2015-12-24 LAB — HEPATITIS C ANTIBODY: HCV AB: NEGATIVE

## 2015-12-24 LAB — HIV ANTIBODY (ROUTINE TESTING W REFLEX): HIV: NONREACTIVE

## 2016-03-23 ENCOUNTER — Ambulatory Visit: Payer: Self-pay

## 2016-03-23 ENCOUNTER — Telehealth: Payer: Self-pay | Admitting: Gastroenterology

## 2016-03-23 NOTE — Telephone Encounter (Signed)
colonoscopy

## 2016-04-28 LAB — HM MAMMOGRAPHY

## 2016-05-02 ENCOUNTER — Encounter: Payer: Self-pay | Admitting: Family

## 2016-06-01 NOTE — Telephone Encounter (Signed)
Multiple message left for pt to return my call. Mailed letter.

## 2016-06-21 ENCOUNTER — Telehealth: Payer: Self-pay | Admitting: Gastroenterology

## 2016-06-21 NOTE — Telephone Encounter (Signed)
Patient called and is ready to schedule her colonoscopy. Please call after 3 pm. Thanks

## 2016-06-29 NOTE — Telephone Encounter (Signed)
Left vm for pt to return my call to schedule colonoscopy. 

## 2016-07-04 NOTE — Telephone Encounter (Signed)
Left vm again today for pt to return my call.  

## 2016-07-13 NOTE — Telephone Encounter (Signed)
Mailed letter requesting a return call to schedule colonoscopy.

## 2016-07-25 ENCOUNTER — Telehealth: Payer: Self-pay | Admitting: Gastroenterology

## 2016-07-25 NOTE — Telephone Encounter (Signed)
Patient left a voice message to make appt for a colonoscopy. She is a Education officer, [employer].  Please call her at  [phone]  [phone]

## 2016-07-26 ENCOUNTER — Other Ambulatory Visit: Payer: Self-pay

## 2016-07-26 ENCOUNTER — Telehealth: Payer: Self-pay | Admitting: Gastroenterology

## 2016-07-26 DIAGNOSIS — Z1211 Encounter for screening for malignant neoplasm of colon: Secondary | ICD-10-CM

## 2016-07-26 NOTE — Telephone Encounter (Signed)
Gastroenterology Pre-Procedure Review  Request Date: 08/12/16 Requesting Physician: Dr. Allen Norris  PATIENT REVIEW QUESTIONS: The patient responded to the following health history questions as indicated:    1. Are you having any GI issues? no 2. Do you have a personal history of Polyps? no 3. Do you have a family history of Colon Cancer or Polyps? yes (polyps: Mother) 4. Diabetes Mellitus? no 5. Joint replacements in the past 12 months?no 6. Major health problems in the past 3 months?no 7. Any artificial heart valves, MVP, or defibrillator?no    MEDICATIONS & ALLERGIES:    Patient reports the following regarding taking any anticoagulation/antiplatelet therapy:   Plavix, Coumadin, Eliquis, Xarelto, Lovenox, Pradaxa, Brilinta, or Effient? no Aspirin? no  Patient confirms/reports the following medications:  Current Outpatient Prescriptions  Medication Sig Dispense Refill  . azithromycin (ZITHROMAX) 250 MG tablet Take 2 tablets (500mg ) by mouth on Day 1. Take 1 tablet (250mg ) by mouth on Days 2-5.    Marland Kitchen HYDROcodone-homatropine (HYCODAN) 5-1.5 MG/5ML syrup Take by mouth.     No current facility-administered medications for this visit.     Patient confirms/reports the following allergies:  No Known Allergies  No orders of the defined types were placed in this encounter.   AUTHORIZATION INFORMATION Primary Insurance: 1D#: Group #:  Secondary Insurance: 1D#: Group #:  SCHEDULE INFORMATION: Date: 08/12/16 Time: Location: [place]

## 2016-07-26 NOTE — Telephone Encounter (Signed)
07/26/16 Spoke with Angela Hogan at [place] prior auth required for Screening Colonoscopy [phone] / [id number]

## 2016-08-09 ENCOUNTER — Encounter: Payer: Self-pay | Admitting: *Deleted

## 2016-08-11 NOTE — Discharge Instructions (Signed)
General Anesthesia, Adult, Care After °These instructions provide you with information about caring for yourself after your procedure. Your health care provider may also give you more specific instructions. Your treatment has been planned according to current medical practices, but problems sometimes occur. Call your health care provider if you have any problems or questions after your procedure. °What can I expect after the procedure? °After the procedure, it is common to have: °· Vomiting. °· A sore throat. °· Mental slowness. ° °It is common to feel: °· Nauseous. °· Cold or shivery. °· Sleepy. °· Tired. °· Sore or achy, even in parts of your body where you did not have surgery. ° °Follow these instructions at home: °For at least 24 hours after the procedure: °· Do not: °? Participate in activities where you could fall or become injured. °? Drive. °? Use heavy machinery. °? Drink alcohol. °? Take sleeping pills or medicines that cause drowsiness. °? Make important decisions or sign legal documents. °? Take care of children on your own. °· Rest. °Eating and drinking °· If you vomit, drink water, juice, or soup when you can drink without vomiting. °· Drink enough fluid to keep your urine clear or pale yellow. °· Make sure you have little or no nausea before eating solid foods. °· Follow the diet recommended by your health care provider. °General instructions °· Have a responsible adult stay with you until you are awake and alert. °· Return to your normal activities as told by your health care provider. Ask your health care provider what activities are safe for you. °· Take over-the-counter and prescription medicines only as told by your health care provider. °· If you smoke, do not smoke without supervision. °· Keep all follow-up visits as told by your health care provider. This is important. °Contact a health care provider if: °· You continue to have nausea or vomiting at home, and medicines are not helpful. °· You  cannot drink fluids or start eating again. °· You cannot urinate after 8-12 hours. °· You develop a skin rash. °· You have fever. °· You have increasing redness at the site of your procedure. °Get help right away if: °· You have difficulty breathing. °· You have chest pain. °· You have unexpected bleeding. °· You feel that you are having a life-threatening or urgent problem. °This information is not intended to replace advice given to you by your health care provider. Make sure you discuss any questions you have with your health care provider. °Document Released: 06/06/2000 Document Revised: 08/03/2015 Document Reviewed: 02/12/2015 °Elsevier Interactive Patient Education © 2018 Elsevier Inc. ° °

## 2016-08-12 ENCOUNTER — Encounter: Payer: Self-pay | Admitting: *Deleted

## 2016-08-12 ENCOUNTER — Ambulatory Visit: Payer: BC Managed Care – PPO | Admitting: Anesthesiology

## 2016-08-12 ENCOUNTER — Ambulatory Visit
Admission: RE | Admit: 2016-08-12 | Discharge: 2016-08-12 | Disposition: A | Discharge status: Home / Self Care | Payer: BC Managed Care – PPO | Source: Ambulatory Visit | Attending: Gastroenterology | Admitting: Gastroenterology

## 2016-08-12 ENCOUNTER — Encounter
Admission: RE | Disposition: A | Discharge status: Home / Self Care | Payer: Self-pay | Source: Ambulatory Visit | Attending: Gastroenterology

## 2016-08-12 DIAGNOSIS — M1990 Unspecified osteoarthritis, unspecified site: Secondary | ICD-10-CM | POA: Insufficient documentation

## 2016-08-12 DIAGNOSIS — E785 Hyperlipidemia, unspecified: Secondary | ICD-10-CM | POA: Insufficient documentation

## 2016-08-12 DIAGNOSIS — D124 Benign neoplasm of descending colon: Secondary | ICD-10-CM

## 2016-08-12 DIAGNOSIS — Z833 Family history of diabetes mellitus: Secondary | ICD-10-CM | POA: Diagnosis not present

## 2016-08-12 DIAGNOSIS — I10 Essential (primary) hypertension: Secondary | ICD-10-CM | POA: Insufficient documentation

## 2016-08-12 DIAGNOSIS — K640 First degree hemorrhoids: Secondary | ICD-10-CM | POA: Diagnosis not present

## 2016-08-12 DIAGNOSIS — Z8249 Family history of ischemic heart disease and other diseases of the circulatory system: Secondary | ICD-10-CM | POA: Insufficient documentation

## 2016-08-12 DIAGNOSIS — Z811 Family history of alcohol abuse and dependence: Secondary | ICD-10-CM | POA: Insufficient documentation

## 2016-08-12 DIAGNOSIS — Z1211 Encounter for screening for malignant neoplasm of colon: Secondary | ICD-10-CM

## 2016-08-12 DIAGNOSIS — K635 Polyp of colon: Secondary | ICD-10-CM | POA: Diagnosis not present

## 2016-08-12 DIAGNOSIS — F329 Major depressive disorder, single episode, unspecified: Secondary | ICD-10-CM | POA: Diagnosis not present

## 2016-08-12 DIAGNOSIS — Z803 Family history of malignant neoplasm of breast: Secondary | ICD-10-CM | POA: Diagnosis not present

## 2016-08-12 HISTORY — DX: Motion sickness, initial encounter: T75.3XXA

## 2016-08-12 HISTORY — PX: POLYPECTOMY: SHX5525

## 2016-08-12 HISTORY — DX: Dizziness and giddiness: R42

## 2016-08-12 HISTORY — PX: COLONOSCOPY WITH PROPOFOL: SHX5780

## 2016-08-12 SURGERY — COLONOSCOPY WITH PROPOFOL
Anesthesia: General | Wound class: Contaminated

## 2016-08-12 MED ORDER — ACETAMINOPHEN 325 MG PO TABS: 325.0000 mg | ORAL_TABLET | ORAL | Status: DC | PRN

## 2016-08-12 MED ORDER — LIDOCAINE HCL (CARDIAC) 20 MG/ML IV SOLN
INTRAVENOUS | Status: DC | PRN
  Administered 2016-08-12: 20 mg via INTRAVENOUS

## 2016-08-12 MED ORDER — PROPOFOL 10 MG/ML IV BOLUS
INTRAVENOUS | Status: DC | PRN
  Administered 2016-08-12: 100 mg via INTRAVENOUS
  Administered 2016-08-12 (×2): 50 mg via INTRAVENOUS

## 2016-08-12 MED ORDER — SODIUM CHLORIDE 0.9 % IV SOLN
INTRAVENOUS | Status: DC
Start: 2016-08-12 — End: 2016-08-12

## 2016-08-12 MED ORDER — ACETAMINOPHEN 160 MG/5ML PO SOLN: 325.0000 mg | ORAL | Status: DC | PRN

## 2016-08-12 MED ORDER — LACTATED RINGERS IV SOLN
INTRAVENOUS | Status: DC
  Administered 2016-08-12: 08:00:00 via INTRAVENOUS

## 2016-08-12 SURGICAL SUPPLY — 23 items

## 2016-08-12 NOTE — Anesthesia Procedure Notes (Signed)
Procedure Name: MAC Date/Time: 08/12/2016 9:37 AM Performed by: Janna Arch Pre-anesthesia Checklist: Patient identified, Emergency Drugs available, Suction available and Patient being monitored Patient Re-evaluated:Patient Re-evaluated prior to inductionOxygen Delivery Method: Nasal cannula

## 2016-08-12 NOTE — Anesthesia Preprocedure Evaluation (Signed)
Anesthesia Evaluation  Patient identified by MRN, date of birth, ID band Patient awake    Reviewed: Allergy & Precautions, H&P , NPO status , Patient's Chart, lab work & pertinent test results  Airway Mallampati: II  TM Distance: >3 FB Neck ROM: full    Dental no notable dental hx.    Pulmonary    Pulmonary exam normal        Cardiovascular hypertension, Normal cardiovascular exam     Neuro/Psych    GI/Hepatic   Endo/Other    Renal/GU      Musculoskeletal   Abdominal   Peds  Hematology   Anesthesia Other Findings   Reproductive/Obstetrics                             Anesthesia Physical Anesthesia Plan  ASA: II  Anesthesia Plan: General   Post-op Pain Management:    Induction:   Airway Management Planned:   Additional Equipment:   Intra-op Plan:   Post-operative Plan:   Informed Consent: I have reviewed the patients History and Physical, chart, labs and discussed the procedure including the risks, benefits and alternatives for the proposed anesthesia with the patient or authorized representative who has indicated his/her understanding and acceptance.     Plan Discussed with:   Anesthesia Plan Comments:         Anesthesia Quick Evaluation

## 2016-08-12 NOTE — H&P (Signed)
   Lucilla Lame, MD [address] [place], [place] [address] Phone: [phone] Fax : [phone]  Primary Care Physician:  Burnard Hawthorne, FNP Primary Gastroenterologist:  Dr. Allen Norris  Pre-Procedure History & Physical: HPI:  Angela Hogan is a 66 y.o. female is here for a screening colonoscopy.   Past Medical History:  Diagnosis Date  . Arthritis   . Depression   . Hyperlipidemia   . Hypertension    h/o-pcp took pt off meds 1 year ago due to controlled bp  . Motion sickness   . Vertigo    last episode approx 1 yr ago    Past Surgical History:  Procedure Laterality Date  . KNEE ARTHROSCOPY Left 07/08/2015   Procedure: ARTHROSCOPY KNEE;  Surgeon: Thornton Park, MD;  Location: [facility] ORS;  Service: Orthopedics;  Laterality: Left;  . TOE SURGERY  2013  . TONSILECTOMY/ADENOIDECTOMY WITH MYRINGOTOMY  1974    Prior to Admission medications   Not on File    Allergies as of 07/26/2016  . (No Known Allergies)    Family History  Problem Relation Age of Onset  . Dementia Mother   . Cancer Mother        Breast  . Diabetes Father   . Heart disease Father   . Dementia Father   . Cancer Sister        Breast  . Alcohol abuse Brother     Social History   Social History  . Marital status: Married    Spouse name: N/A  . Number of children: N/A  . Years of education: N/A   Occupational History  . Not on file.   Social History Main Topics  . Smoking status: Never Smoker  . Smokeless tobacco: Never Used  . Alcohol use 1.2 oz/week    1 Glasses of wine, 1 Cans of beer per week     Comment:    . Drug use: No  . Sexual activity: Not on file   Other Topics Concern  . Not on file   Social History Narrative   Lives in [place] with husband. 3 children, 2 girls 1 boy. 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 Neck:  Supple; no masses or thyromegaly. Lungs:  Clear throughout to auscultation.    Heart:  Regular rate and rhythm. Abdomen:  Soft, nontender and nondistended. Normal bowel sounds, without guarding, and without rebound.   Neurologic:  Alert and  oriented x4;  grossly normal neurologically.  Impression/Plan: Angela Hogan is now here to undergo a screening colonoscopy.  Risks, benefits, and alternatives regarding colonoscopy have been reviewed with the patient.  Questions have been answered.  All parties agreeable.

## 2016-08-12 NOTE — Anesthesia Postprocedure Evaluation (Signed)
Anesthesia Post Note  Patient: Angela Hogan  Procedure(s) Performed: Procedure(s) (LRB): COLONOSCOPY WITH PROPOFOL (N/A) POLYPECTOMY (N/A)  Patient location during evaluation: PACU Anesthesia Type: General Level of consciousness: awake and alert and oriented Pain management: satisfactory to patient Vital Signs Assessment: post-procedure vital signs reviewed and stable Respiratory status: spontaneous breathing, nonlabored ventilation and respiratory function stable Cardiovascular status: blood pressure returned to baseline and stable Postop Assessment: Adequate PO intake and No signs of nausea or vomiting Anesthetic complications: no    Raliegh Ip

## 2016-08-12 NOTE — Op Note (Signed)
[facility] Gastroenterology Patient Name: Angela Hogan Procedure Date: 08/12/2016 9:35 AM MRN: [id number] Account #: [id number] Date of Birth: [birth year] Admit Type: Outpatient Age: 66 Room: [facility] OR ROOM 01 Gender: Female Note Status: Finalized Procedure:            Colonoscopy Indications:          Screening for colorectal malignant neoplasm Providers:            Lucilla Lame MD, MD Referring MD:         Yvetta Coder. Arnett (Referring MD) Medicines:            Propofol per Anesthesia Complications:        No immediate complications. Procedure:            Pre-Anesthesia Assessment:                       - Prior to the procedure, a History and Physical was                        performed, and patient medications and allergies were                        reviewed. The patient's tolerance of previous                        anesthesia was also reviewed. The risks and benefits of                        the procedure and the sedation options and risks were                        discussed with the patient. All questions were                        answered, and informed consent was obtained. Prior                        Anticoagulants: The patient has taken no previous                        anticoagulant or antiplatelet agents. ASA Grade                        Assessment: II - A patient with mild systemic disease.                        After reviewing the risks and benefits, the patient was                        deemed in satisfactory condition to undergo the                        procedure.                       After obtaining informed consent, the colonoscope was                        passed under direct vision. Throughout the procedure,  the patient's blood pressure, pulse, and oxygen                        saturations were monitored continuously. The [place] [phone]) was introduced through the                      anus and advanced to the the cecum, identified by                        appendiceal orifice and ileocecal valve. The                        colonoscopy was performed without difficulty. The                        patient tolerated the procedure well. The quality of                        the bowel preparation was excellent. Findings:      The perianal and digital rectal examinations were normal.      Two sessile polyps were found in the descending colon. The polyps were 3       to 4 mm in size. These polyps were removed with a cold biopsy forceps.       Resection and retrieval were complete.      Non-bleeding internal hemorrhoids were found during retroflexion. The       hemorrhoids were Grade I (internal hemorrhoids that do not prolapse). Impression:           - Two 3 to 4 mm polyps in the descending colon, removed                        with a cold biopsy forceps. Resected and retrieved.                       - Non-bleeding internal hemorrhoids. Recommendation:       - Discharge patient to home.                       - Resume previous diet.                       - Continue present medications.                       - Await pathology results.                       - Repeat colonoscopy in 5 years if polyp adenoma and 10                        years if hyperplastic Procedure Code(s):    --- Professional ---                       [phone], Colonoscopy, flexible; with biopsy, single or                        multiple Diagnosis Code(s):    ---  Professional ---                       Z12.11, Encounter for screening for malignant neoplasm                        of colon                       D12.4, Benign neoplasm of descending colon CPT copyright 2016 American Medical Association. All rights reserved. The codes documented in this report are preliminary and upon coder review may  be revised to meet current compliance requirements. Lucilla Lame MD, MD 08/12/2016 9:54:53 AM This  report has been signed electronically. Number of Addenda: 0 Note Initiated On: 08/12/2016 9:35 AM Scope Withdrawal Time: 0 hours 7 minutes 45 seconds  Total Procedure Duration: 0 hours 10 minutes 28 seconds       [facility]

## 2016-08-12 NOTE — Transfer of Care (Signed)
Immediate Anesthesia Transfer of Care Note  Patient: Angela Hogan  Procedure(s) Performed: Procedure(s): COLONOSCOPY WITH PROPOFOL (N/A) POLYPECTOMY (N/A)  Patient Location: PACU  Anesthesia Type: General  Level of Consciousness: awake, alert  and patient cooperative  Airway and Oxygen Therapy: Patient Spontanous Breathing and Patient connected to supplemental oxygen  Post-op Assessment: Post-op Vital signs reviewed, Patient's Cardiovascular Status Stable, Respiratory Function Stable, Patent Airway and No signs of Nausea or vomiting  Post-op Vital Signs: Reviewed and stable  Complications: No apparent anesthesia complications

## 2016-08-15 ENCOUNTER — Encounter: Payer: Self-pay | Admitting: Gastroenterology

## 2016-08-17 ENCOUNTER — Encounter: Payer: Self-pay | Admitting: Gastroenterology

## 2016-08-18 ENCOUNTER — Encounter: Payer: Self-pay | Admitting: Gastroenterology

## 2016-08-25 ENCOUNTER — Telehealth: Payer: Self-pay | Admitting: Gastroenterology

## 2016-08-25 NOTE — Telephone Encounter (Signed)
Patient called with a question regarding the letter she was sent about pathology. Please call

## 2016-08-26 NOTE — Telephone Encounter (Signed)
LVM for pt to return my call.

## 2016-08-31 NOTE — Telephone Encounter (Signed)
LVM for pt to return my call.

## 2016-09-09 ENCOUNTER — Telehealth: Payer: Self-pay | Admitting: Gastroenterology

## 2016-09-09 NOTE — Telephone Encounter (Signed)
Patient left a voice message that you have been calling her but she was out of town. Please call [phone]

## 2016-09-09 NOTE — Telephone Encounter (Signed)
LVM for pt to return my call.

## 2016-09-09 NOTE — Telephone Encounter (Signed)
Pt had a question regarding procedure results. All questions answered.

## 2017-05-16 ENCOUNTER — Telehealth: Payer: Self-pay | Admitting: Family

## 2017-05-16 NOTE — Telephone Encounter (Signed)
[facility]   In reviewing your chart, it appears your are due for annual mammogram.  Please let us know if you would like for me to order - or you may already have scheduled.    My best,   Mable Paris, NP

## 2017-05-23 NOTE — Telephone Encounter (Signed)
Letter printed and mailed.  

## 2017-06-13 ENCOUNTER — Encounter: Payer: Self-pay | Admitting: Family

## 2017-06-14 ENCOUNTER — Telehealth: Payer: Self-pay | Admitting: Family

## 2017-06-14 NOTE — Telephone Encounter (Signed)
Pt is requesting to transfer care from Mable Paris to Dr. Damita Dunnings. Is it OK to transfer?   Copied from [employer] [id number]. Topic: General - Other >> Jun 12, 2017  7:16 PM Cecelia Byars, NT wrote: Reason for CRM: Patient was referred by Charlaine Dalton to Dr Damita Dunnings she was told that he is not taking new patients she was insistent that he is asked concerning this matter , she was told who in the practice is accepting patients  Please call her at [phone]

## 2017-06-14 NOTE — Telephone Encounter (Signed)
Okay with me to get set up, needs 30 min OV when possible.  Thanks.

## 2017-08-04 LAB — HM PAP SMEAR

## 2017-08-24 ENCOUNTER — Ambulatory Visit: Payer: BC Managed Care – PPO | Admitting: Family Medicine

## 2017-08-24 ENCOUNTER — Encounter: Payer: Self-pay | Admitting: Family Medicine

## 2017-08-24 VITALS — BP 130/82 | HR 75 | Temp 97.6°F | Ht 66.0 in | Wt 202.0 lb

## 2017-08-24 DIAGNOSIS — E559 Vitamin D deficiency, unspecified: Secondary | ICD-10-CM

## 2017-08-24 DIAGNOSIS — Z7189 Other specified counseling: Secondary | ICD-10-CM

## 2017-08-24 DIAGNOSIS — E785 Hyperlipidemia, unspecified: Secondary | ICD-10-CM

## 2017-08-24 DIAGNOSIS — F4323 Adjustment disorder with mixed anxiety and depressed mood: Secondary | ICD-10-CM

## 2017-08-24 DIAGNOSIS — Z0000 Encounter for general adult medical examination without abnormal findings: Secondary | ICD-10-CM

## 2017-08-24 MED ORDER — VITAMIN D 1000 UNITS PO TABS: 1000.0000 [IU] | ORAL_TABLET | Freq: Every day | ORAL | Status: DC

## 2017-08-24 NOTE — Patient Instructions (Addendum)
Recheck labs in about 3 months at a lab visit.   Work on diet and exercise in the meantime.   Add on vit D 1000 IU a day.  Take care.  Glad to see you.  Update me as needed.

## 2017-08-24 NOTE — Progress Notes (Signed)
CPE- See plan.  Routine anticipatory guidance given to patient.  See health maintenance.  The possibility exists that previously documented standard health maintenance information may have been brought forward from a previous encounter into this note.  If needed, that same information has been updated to reflect the current situation based on today's encounter.    Colonoscopy 2018.   Pap smear per 3 weeks ago per Dr. Garwin Brothers.   She has yearly mammogram.  Sister with breast cancer.   DXA not yet due.  zostavax 2015 Flu shot done yearly.   PNA due at 67 [facility]ap [facility]/17/10 at [facility].   Advance directive d/w pt.  Husband designated if patient were incapacitated. Diet and exercise d/w pt.  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    She has stressors with finances.  She is still working.  She was prev on zoloft.  "I've worked my whole life and put 3 kids through college but retirement may not be like it should."  She has been happily married for 42 years.   D/w pt about exercise as that can help.  She misses her kids who are out of town.  Depression screening score noted but patient is not suicidal or homicidal and wants to work on diet and exercise first to see how much that affects her mood.  She expects that would help her mood significantly.  This is a reasonable plan and she will update me as needed in the meantime.  She is going to see [facility] derm today.  PMH and SH reviewed  Meds, vitals, and allergies reviewed.   ROS: Per HPI.  Unless specifically indicated otherwise in HPI, the patient denies:  General: fever. Eyes: acute vision changes ENT: sore throat Cardiovascular: chest pain Respiratory: SOB GI: vomiting GU: dysuria Musculoskeletal: acute back pain Derm: acute rash Neuro: acute motor dysfunction Psych: worsening mood Endocrine: polydipsia Heme: bleeding Allergy: hayfever  GEN: nad, alert and oriented HEENT: mucous membranes moist NECK: supple w/o  LA CV: rrr. PULM: ctab, no inc wob ABD: soft, +bs EXT: no edema SKIN: no acute rash

## 2017-08-25 DIAGNOSIS — Z7189 Other specified counseling: Secondary | ICD-10-CM | POA: Insufficient documentation

## 2017-08-25 NOTE — Assessment & Plan Note (Signed)
Advance directive- d/w pt. Husband designated if patient were incapacitated.  

## 2017-08-25 NOTE — Assessment & Plan Note (Signed)
She has stressors with finances.  She is still working.  She was prev on zoloft.  "I've worked my whole life and put 3 kids through college but retirement may not be like it should."  She has been happily married for 42 years.   D/w pt about exercise as that can help.  She misses her kids who are out of town.  Depression screening score noted but patient is not suicidal or homicidal and wants to work on diet and exercise first to see how much that affects her mood.  She expects that would help her mood significantly.  This is a reasonable plan and she will update me as needed in the meantime.

## 2017-08-25 NOTE — Assessment & Plan Note (Addendum)
Colonoscopy 2018.   Pap smear per 3 weeks ago per Dr. Garwin Brothers.   She has yearly mammogram.  Sister with breast cancer.   DXA not yet due.  zostavax 2015 Flu shot done yearly.   PNA due at 67 Tdap 05/17/10 at health department.   Advance directive d/w pt.  Husband designated if patient were incapacitated. Diet and exercise d/w pt.  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    She will work on diet and exercise and restart vitamin D in the meantime.  We can recheck labs later this year.  She agrees.

## 2017-09-22 ENCOUNTER — Encounter: Payer: Self-pay | Admitting: Family Medicine

## 2017-11-15 ENCOUNTER — Other Ambulatory Visit: Payer: Self-pay

## 2017-11-28 ENCOUNTER — Telehealth: Payer: Self-pay

## 2017-11-28 NOTE — Telephone Encounter (Signed)
Rescheduled patients appointment for earlier that same [place], RMA

## 2017-11-28 NOTE — Telephone Encounter (Signed)
PLEASE NOTE: All timestamps contained within this report are represented as [place] Standard Time. CONFIDENTIALTY NOTICE: This fax transmission is intended only for the addressee. It contains information that is legally privileged, confidential or otherwise protected from use or disclosure. If you are not the intended recipient, you are strictly prohibited from reviewing, disclosing, copying using or disseminating any of this information or taking any action in reliance on or regarding this information. If you have received this fax in error, please notify us immediately by telephone so that we can arrange for its return to [place]. Phone: [phone], Toll-Free: [phone], Fax: [phone] Page: 1 of 1 Call Id: [id number] [place] Night - Client Nonclinical Telephone Record [place] Night - Client Client Site [place] Physician Renford Dills - MD Contact Type Call Who Is Calling Patient / Member / Family / Caregiver Caller Name Lean Jaeger Caller Phone Number [phone] Patient Name Kimiko Common Patient DOB 01-03-1951 Call Type Message Only Information Provided Reason for Call Request to Reschedule Office Appointment Initial Comment Caller states she is needing to reschedule her apt. Additional Comment Call Closed By: Bethann Berkshire Transaction Date/Time: 11/28/2017 6:12:20 AM (ET)

## 2017-11-30 ENCOUNTER — Telehealth: Payer: Self-pay

## 2017-11-30 ENCOUNTER — Other Ambulatory Visit (INDEPENDENT_AMBULATORY_CARE_PROVIDER_SITE_OTHER): Payer: BC Managed Care – PPO

## 2017-11-30 ENCOUNTER — Other Ambulatory Visit: Payer: Self-pay

## 2017-11-30 DIAGNOSIS — E559 Vitamin D deficiency, unspecified: Secondary | ICD-10-CM | POA: Diagnosis not present

## 2017-11-30 DIAGNOSIS — E785 Hyperlipidemia, unspecified: Secondary | ICD-10-CM

## 2017-11-30 LAB — LIPID PANEL
CHOLESTEROL: 247 mg/dL — AB (ref 0–200)
HDL: 55.3 mg/dL (ref >39.00)
LDL CALC: 166 mg/dL — AB (ref 0–99)
NONHDL: 191.67
Total CHOL/HDL Ratio: 4
Triglycerides: 128 mg/dL (ref 0.0–149.0)
VLDL: 25.6 mg/dL (ref 0.0–40.0)

## 2017-11-30 LAB — COMPREHENSIVE METABOLIC PANEL
ALBUMIN: 4.4 g/dL (ref 3.5–5.2)
ALT: 23 U/L (ref 0–35)
AST: 16 U/L (ref 0–37)
Alkaline Phosphatase: 55 U/L (ref 39–117)
BILIRUBIN TOTAL: 0.5 mg/dL (ref 0.2–1.2)
BUN: 24 mg/dL — AB (ref 6–23)
CHLORIDE: 107 meq/L (ref 96–112)
CO2: 27 meq/L (ref 19–32)
CREATININE: 0.78 mg/dL (ref 0.40–1.20)
Calcium: 9.5 mg/dL (ref 8.4–10.5)
GFR: 78.78 mL/min (ref >60.00)
Glucose, Bld: 105 mg/dL — ABNORMAL HIGH (ref 70–99)
Potassium: 4.2 mEq/L (ref 3.5–5.1)
SODIUM: 142 meq/L (ref 135–145)
TOTAL PROTEIN: 7.3 g/dL (ref 6.0–8.3)

## 2017-11-30 LAB — VITAMIN D 25 HYDROXY (VIT D DEFICIENCY, FRACTURES): VITD: 22.98 ng/mL — ABNORMAL LOW (ref 30.00–100.00)

## 2017-11-30 NOTE — Telephone Encounter (Signed)
Pt is already at lab. Nothing further to do.

## 2017-11-30 NOTE — Telephone Encounter (Signed)
PLEASE NOTE: All timestamps contained within this report are represented as [place] Standard Time. CONFIDENTIALTY NOTICE: This fax transmission is intended only for the addressee. It contains information that is legally privileged, confidential or otherwise protected from use or disclosure. If you are not the intended recipient, you are strictly prohibited from reviewing, disclosing, copying using or disseminating any of this information or taking any action in reliance on or regarding this information. If you have received this fax in error, please notify us immediately by telephone so that we can arrange for its return to [place]. Phone: [phone], Toll-Free: [phone], Fax: [phone] Page: 1 of 1 Call Id: [id number] [place] Patient Name: Angela Hogan Gender: Female DOB: [birth year] Age: 67 Y 4 D Return Phone Number: [phone] (Primary) Address: City/State/Zip: [place] [address] Client [facility] [place] Night - Client Client Site [place] Physician Renford Dills - MD Contact Type Call Who Is Calling Patient / Member / Family / Caregiver Call Type Triage / Clinical Relationship To Patient Self Return Phone Number [phone] (Primary) Chief Complaint Health information question (non symptomatic) Reason for Call Symptomatic / Request for [place] states she has an appointment for lab work. Caller states she would like to know when she needs to start fasting. Translation No Nurse Assessment Guidelines Guideline Title Affirmed Question Affirmed Notes Nurse Date/Time (Eastern Time) Disp. Time Eilene Ghazi Time) Disposition Final User 11/29/2017 11:13:39 PM Attempt made - message left [place], RN, April 11/29/2017 11:29:16 PM FINAL ATTEMPT MADE - message left Yes Robby Sermon, RN, April

## 2017-12-11 ENCOUNTER — Encounter: Payer: Self-pay | Admitting: *Deleted

## 2017-12-11 ENCOUNTER — Other Ambulatory Visit: Payer: Self-pay | Admitting: Family Medicine

## 2017-12-11 DIAGNOSIS — E559 Vitamin D deficiency, unspecified: Secondary | ICD-10-CM

## 2017-12-11 MED ORDER — VITAMIN D (ERGOCALCIFEROL) 1.25 MG (50000 UNIT) PO CAPS: 50000.0000 [IU] | ORAL_CAPSULE | ORAL | 0 refills | Status: DC

## 2017-12-14 ENCOUNTER — Telehealth: Payer: Self-pay | Admitting: Family Medicine

## 2017-12-14 NOTE — Telephone Encounter (Signed)
Left VM for patient to call office.

## 2017-12-22 NOTE — Telephone Encounter (Signed)
Patient is requesting a call back from the nurse.  Regarding "left vm for patient to call our office."

## 2018-05-08 LAB — HM MAMMOGRAPHY

## 2018-05-22 ENCOUNTER — Encounter: Payer: Self-pay | Admitting: Family Medicine

## 2018-06-20 ENCOUNTER — Other Ambulatory Visit: Payer: Self-pay | Admitting: Family Medicine

## 2018-11-12 ENCOUNTER — Ambulatory Visit: Payer: BC Managed Care – PPO | Admitting: Family Medicine

## 2018-12-13 ENCOUNTER — Encounter: Payer: Self-pay | Admitting: Family Medicine

## 2018-12-17 ENCOUNTER — Encounter: Payer: Self-pay | Admitting: Family Medicine

## 2018-12-17 NOTE — Progress Notes (Signed)
Please send letter to patient after I sign it.  Thanks.

## 2018-12-19 ENCOUNTER — Encounter: Payer: Self-pay | Admitting: Family Medicine

## 2018-12-21 NOTE — Telephone Encounter (Signed)
Form printed today and placed in Dr Josefine Class Inbox for review.

## 2018-12-23 NOTE — Telephone Encounter (Signed)
I did my portion of the form.  She qualifies with age >66.  She'll have to fill out the rest of the form.  Thanks.  Please scan my portion of the form.

## 2018-12-24 ENCOUNTER — Telehealth: Payer: Self-pay

## 2018-12-24 NOTE — Telephone Encounter (Signed)
Pt called to give fax number for work  Please send dr portion and she will pick up her part and fill out and turn in  Fax [phone]  @ [place]

## 2018-12-24 NOTE — Telephone Encounter (Signed)
LM letting patient know that her paperwork for ABSS was filled out by Dr. Damita Dunnings. Her portion was not filled out, so I did not fax. I need to know if patient would like to come pick up the form.

## 2018-12-24 NOTE — Telephone Encounter (Signed)
I have faxed to [place].

## 2018-12-25 NOTE — Telephone Encounter (Signed)
I filled out the third page and it should be in Lugene's inbox.  Thanks.

## 2019-04-17 ENCOUNTER — Telehealth: Payer: Self-pay

## 2019-04-17 NOTE — Telephone Encounter (Signed)
Letter printed and placed on desk for signature with envelope.

## 2019-04-17 NOTE — Telephone Encounter (Signed)
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 She will need the note so she does not loose her job. Please review.

## 2019-04-17 NOTE — Telephone Encounter (Signed)
Letter done.  Please make sure printed.  Please send to patient after I sign the hardcopy.

## 2019-07-03 LAB — HM MAMMOGRAPHY

## 2019-07-05 ENCOUNTER — Encounter: Payer: Self-pay | Admitting: Family Medicine

## 2019-11-11 ENCOUNTER — Encounter: Payer: Self-pay | Admitting: Family Medicine

## 2019-11-18 ENCOUNTER — Encounter: Payer: Self-pay | Admitting: Family Medicine

## 2019-11-19 ENCOUNTER — Encounter: Payer: Self-pay | Admitting: Family Medicine

## 2019-11-19 ENCOUNTER — Telehealth (INDEPENDENT_AMBULATORY_CARE_PROVIDER_SITE_OTHER): Payer: Medicare PPO | Admitting: Family Medicine

## 2019-11-19 ENCOUNTER — Other Ambulatory Visit: Payer: Self-pay

## 2019-11-19 DIAGNOSIS — F4323 Adjustment disorder with mixed anxiety and depressed mood: Secondary | ICD-10-CM

## 2019-11-19 DIAGNOSIS — E785 Hyperlipidemia, unspecified: Secondary | ICD-10-CM

## 2019-11-19 DIAGNOSIS — E559 Vitamin D deficiency, unspecified: Secondary | ICD-10-CM | POA: Diagnosis not present

## 2019-11-19 MED ORDER — SERTRALINE HCL 25 MG PO TABS: 25.0000 mg | ORAL_TABLET | Freq: Every day | ORAL | 1 refills | Status: DC

## 2019-11-19 NOTE — Progress Notes (Signed)
Interactive audio and video telecommunications were attempted between this provider and patient, however failed, due to patient having technical difficulties OR patient did not have access to video capability.  We continued and completed visit with audio only.   Virtual Visit via Telephone Note  I connected with patient on 11/19/19  at 3:36 PM  by telephone and verified that I am speaking with the correct person using two identifiers.  Location of patient: home.    Location of MD: [facility] Name of referring provider (if blank then none associated): Names per persons and role in encounter:  MD: Earlyne Iba, Patient: name listed above.    I discussed the limitations, risks, security and privacy concerns of performing an evaluation and management service by telephone and the availability of in person appointments. I also discussed with the patient that there may be a patient responsible charge related to this service. The patient expressed understanding and agreed to proceed.  CC: follow up.    History of Present Illness:   Has been in counseling.  She has moved to [place], [place].   According to patient, her mood was worse recently.  She discussed this with her counselor.  Stressors d/w pt, est re: her daughter.  She has been more tearful.  No SI/HI.  Counseling did help, she has been going with her husband to counseling for over 10 years.  Still in counseling via facetime.  She wants to be able to enjoy retirement.    Husband recently in ER, d/w pt.  Daughter has borderline personality disorder, per patient report.  This strains her relationship with other family members.  She is safe at home   Observations/Objective:  nad Speech wnl Judgment intact.  Assessment and Plan:  Adjustment disorder.  Stressors noted.  Reasonable to continue counseling.  No suicidal or homicidal intent.  Safe at home.  Okay for outpatient follow-up.  Start zoloft.  Routine cautions d/w pt. routine  instructions given to patient.  Reasonable to check baseline labs.  We will send her a letter so she can get those done.  She will update me about how she is doing when she is started Zoloft.  See letter regarding labs.  Follow Up Instructions: see above.     I discussed the assessment and treatment plan with the patient. The patient was provided an opportunity to ask questions and all were answered. The patient agreed with the plan and demonstrated an understanding of the instructions.   The patient was advised to call back or seek an in-person evaluation if the symptoms worsen or if the condition fails to improve as anticipated.  I provided 21 minutes of non-face-to-face time during this encounter.  Elsie Stain, MD

## 2019-11-20 ENCOUNTER — Encounter: Payer: Self-pay | Admitting: Family Medicine

## 2019-11-20 NOTE — Assessment & Plan Note (Signed)
Adjustment disorder.  Stressors noted.  Reasonable to continue counseling.  No suicidal or homicidal intent.  Safe at home.  Okay for outpatient follow-up.  Start zoloft.  Routine cautions d/w pt. routine instructions given to patient.  Reasonable to check baseline labs.  We will send her a letter so she can get those done.  She will update me about how she is doing when she is started Zoloft.

## 2019-11-22 ENCOUNTER — Encounter: Payer: Self-pay | Admitting: Family Medicine

## 2019-11-29 ENCOUNTER — Encounter: Payer: Self-pay | Admitting: Family Medicine

## 2019-12-03 LAB — LAB REPORT - SCANNED
ALT: 41 — AB (ref 3–30)
Cholesterol: 224
Creatinine, Ser: 0.94
Glucose: 107
HDL: 52
LDL (calc): 150
TSH: 1.2
Triglycerides: 109 (ref 40–160)

## 2020-01-09 ENCOUNTER — Encounter: Payer: Self-pay | Admitting: Family Medicine

## 2020-01-09 LAB — LAB REPORT - SCANNED: AST: 12

## 2020-03-30 ENCOUNTER — Encounter: Payer: Self-pay | Admitting: Family Medicine

## 2020-03-31 ENCOUNTER — Encounter: Payer: Self-pay | Admitting: Family Medicine

## 2020-06-07 ENCOUNTER — Other Ambulatory Visit: Payer: Self-pay | Admitting: Family Medicine

## 2020-07-20 ENCOUNTER — Telehealth: Payer: Self-pay

## 2020-07-20 NOTE — Telephone Encounter (Signed)
Patient needs AWV phone visit in June prior to her CPE visit on 08/28/20 with Dr Damita Dunnings. There were no openings yet for June for your schedule. Please call patient to schedule or let me know when I can schedule patient. Her husband also needs same thing and I am sending a note on him to you also. Thank you!  Please call husband to schedule. Thank you

## 2020-07-21 NOTE — Telephone Encounter (Signed)
Routing to AWV scheduler for [place], Bernice Cicero. 

## 2020-07-22 DIAGNOSIS — I10 Essential (primary) hypertension: Secondary | ICD-10-CM | POA: Insufficient documentation

## 2020-07-23 ENCOUNTER — Telehealth: Payer: Self-pay

## 2020-07-23 NOTE — Telephone Encounter (Signed)
I would have her monitor her BP over the next few days.  Did they give her specific covid related treatment or guidelines?  When did her sx start?  Please let me know.  Thanks.

## 2020-07-23 NOTE — Telephone Encounter (Signed)
Spoke with the patient. She is aware to monitor her BP. She stated that they did discuss treatment with her but she declined. Symptoms started on Saturday 7th

## 2020-07-23 NOTE — Telephone Encounter (Signed)
If she is doing well enough re: covid at this point, then she wouldn't have to start treatment.  I would still defer tx for BP until we have more data over the next few days.  Please update me if BP persistently >140/>90 after a few days.  Thanks.

## 2020-07-23 NOTE — Telephone Encounter (Signed)
Pt was seen in [facility] yesterday [facility] [place]  BP was 160/106---tested positive for Covid yesterday but her BP cuff is not working... Denies CP tightness... other than having URI Sx...  Pt would like advice

## 2020-07-23 NOTE — Telephone Encounter (Signed)
Spoke with the patient. She is aware of Dr. Josefine Class message and will call back in a few days with BP readings. Nothing further needed.

## 2020-07-28 ENCOUNTER — Other Ambulatory Visit: Payer: Self-pay

## 2020-07-28 ENCOUNTER — Ambulatory Visit (INDEPENDENT_AMBULATORY_CARE_PROVIDER_SITE_OTHER): Payer: Medicare PPO

## 2020-07-28 DIAGNOSIS — Z0000 Encounter for general adult medical examination without abnormal findings: Secondary | ICD-10-CM

## 2020-07-28 NOTE — Progress Notes (Signed)
PCP notes:  Health Maintenance: Prevnar 70- due Mammogram- scheduled 08/2020 Dexa- due   Abnormal Screenings: none   Patient concerns: Cramping in hands, feet and legs   Nurse concerns: none   Next PCP appt.: 08/28/2020 @ 11:30 am

## 2020-07-28 NOTE — Patient Instructions (Signed)
Angela Hogan , Thank you for taking time to come for your Medicare Wellness Visit. I appreciate your ongoing commitment to your health goals. Please review the following plan we discussed and let me know if I can assist you in the future.   Screening recommendations/referrals: Colonoscopy: Up to date, completed 08/12/2016, due 08/2026 Mammogram: scheduled 08/2020 Bone Density: due, Please call an scheduled appointment to be completed along with mammogram next month  Recommended yearly ophthalmology/optometry visit for glaucoma screening and checkup Recommended yearly dental visit for hygiene and checkup  Vaccinations: Influenza vaccine: Up to date, completed 12/02/2019, due 10/2020 Pneumococcal vaccine: due, will discuss with provider at physical Tdap vaccine: decline-insurance Shingles vaccine: due, check with your insurance regarding coverage if interested    Covid-19:Completed series, please bring card so we can document dates in your chart  Advanced directives: Please bring a copy of your POA (Power of [place]) and/or Living Will to your next appointment.   Conditions/risks identified: hyperlipidemia   Next appointment: Follow up in one year for your annual wellness visit    Preventive Care 65 Years and Older, Female Preventive care refers to lifestyle choices and visits with your health care provider that can promote health and wellness. What does preventive care include?  A yearly physical exam. This is also called an annual well check.  Dental exams once or twice a year.  Routine eye exams. Ask your health care provider how often you should have your eyes checked.  Personal lifestyle choices, including:  Daily care of your teeth and gums.  Regular physical activity.  Eating a healthy diet.  Avoiding tobacco and drug use.  Limiting alcohol use.  Practicing safe sex.  Taking low-dose aspirin every day.  Taking vitamin and mineral supplements as recommended by your health  care provider. What happens during an annual well check? The services and screenings done by your health care provider during your annual well check will depend on your age, overall health, lifestyle risk factors, and family history of disease. Counseling  Your health care provider may ask you questions about your:  Alcohol use.  Tobacco use.  Drug use.  Emotional well-being.  Home and relationship well-being.  Sexual activity.  Eating habits.  History of falls.  Memory and ability to understand (cognition).  Work and work Statistician.  Reproductive health. Screening  You may have the following tests or measurements:  Height, weight, and BMI.  Blood pressure.  Lipid and cholesterol levels. These may be checked every 5 years, or more frequently if you are over 59 years old.  Skin check.  Lung cancer screening. You may have this screening every year starting at age 60 if you have a 30-pack-year history of smoking and currently smoke or have quit within the past 15 years.  Fecal occult blood test (FOBT) of the stool. You may have this test every year starting at age 42.  Flexible sigmoidoscopy or colonoscopy. You may have a sigmoidoscopy every 5 years or a colonoscopy every 10 years starting at age 73.  Hepatitis C blood test.  Hepatitis B blood test.  Sexually transmitted disease (STD) testing.  Diabetes screening. This is done by checking your blood sugar (glucose) after you have not eaten for a while (fasting). You may have this done every 1-3 years.  Bone density scan. This is done to screen for osteoporosis. You may have this done starting at age 9.  Mammogram. This may be done every 1-2 years. Talk to your health care provider  about how often you should have regular mammograms. Talk with your health care provider about your test results, treatment options, and if necessary, the need for more tests. Vaccines  Your health care provider may recommend certain  vaccines, such as:  Influenza vaccine. This is recommended every year.  Tetanus, diphtheria, and acellular pertussis (Tdap, Td) vaccine. You may need a Td booster every 10 years.  Zoster vaccine. You may need this after age 46.  Pneumococcal 13-valent conjugate (PCV13) vaccine. One dose is recommended after age 37.  Pneumococcal polysaccharide (PPSV23) vaccine. One dose is recommended after age 69. Talk to your health care provider about which screenings and vaccines you need and how often you need them. This information is not intended to replace advice given to you by your health care provider. Make sure you discuss any questions you have with your health care provider. Document Released: 03/27/2015 Document Revised: 11/18/2015 Document Reviewed: 12/30/2014 Elsevier Interactive Patient Education  2017 [employer] Prevention in the Home Falls can cause injuries. They can happen to people of all ages. There are many things you can do to make your home safe and to help prevent falls. What can I do on the outside of my home?  Regularly fix the edges of walkways and driveways and fix any cracks.  Remove anything that might make you trip as you walk through a door, such as a raised step or threshold.  Trim any bushes or trees on the path to your home.  Use bright outdoor lighting.  Clear any walking paths of anything that might make someone trip, such as rocks or tools.  Regularly check to see if handrails are loose or broken. Make sure that both sides of any steps have handrails.  Any raised decks and porches should have guardrails on the edges.  Have any leaves, snow, or ice cleared regularly.  Use sand or salt on walking paths during winter.  Clean up any spills in your garage right away. This includes oil or grease spills. What can I do in the bathroom?  Use night lights.  Install grab bars by the toilet and in the tub and shower. Do not use towel bars as grab  bars.  Use non-skid mats or decals in the tub or shower.  If you need to sit down in the shower, use a plastic, non-slip stool.  Keep the floor dry. Clean up any water that spills on the floor as soon as it happens.  Remove soap buildup in the tub or shower regularly.  Attach bath mats securely with double-sided non-slip rug tape.  Do not have throw rugs and other things on the floor that can make you trip. What can I do in the bedroom?  Use night lights.  Make sure that you have a light by your bed that is easy to reach.  Do not use any sheets or blankets that are too big for your bed. They should not hang down onto the floor.  Have a firm chair that has side arms. You can use this for support while you get dressed.  Do not have throw rugs and other things on the floor that can make you trip. What can I do in the kitchen?  Clean up any spills right away.  Avoid walking on wet floors.  Keep items that you use a lot in easy-to-reach places.  If you need to reach something above you, use a strong step stool that has a grab bar.  Keep electrical cords out of the way.  Do not use floor polish or wax that makes floors slippery. If you must use wax, use non-skid floor wax.  Do not have throw rugs and other things on the floor that can make you trip. What can I do with my stairs?  Do not leave any items on the stairs.  Make sure that there are handrails on both sides of the stairs and use them. Fix handrails that are broken or loose. Make sure that handrails are as long as the stairways.  Check any carpeting to make sure that it is firmly attached to the stairs. Fix any carpet that is loose or worn.  Avoid having throw rugs at the top or bottom of the stairs. If you do have throw rugs, attach them to the floor with carpet tape.  Make sure that you have a light switch at the top of the stairs and the bottom of the stairs. If you do not have them, ask someone to add them for  you. What else can I do to help prevent falls?  Wear shoes that:  Do not have high heels.  Have rubber bottoms.  Are comfortable and fit you well.  Are closed at the toe. Do not wear sandals.  If you use a stepladder:  Make sure that it is fully opened. Do not climb a closed stepladder.  Make sure that both sides of the stepladder are locked into place.  Ask someone to hold it for you, if possible.  Clearly mark and make sure that you can see:  Any grab bars or handrails.  First and last steps.  Where the edge of each step is.  Use tools that help you move around (mobility aids) if they are needed. These include:  Canes.  Walkers.  Scooters.  Crutches.  Turn on the lights when you go into a dark area. Replace any light bulbs as soon as they burn out.  Set up your furniture so you have a clear path. Avoid moving your furniture around.  If any of your floors are uneven, fix them.  If there are any pets around you, be aware of where they are.  Review your medicines with your doctor. Some medicines can make you feel dizzy. This can increase your chance of falling. Ask your doctor what other things that you can do to help prevent falls. This information is not intended to replace advice given to you by your health care provider. Make sure you discuss any questions you have with your health care provider. Document Released: 12/25/2008 Document Revised: 08/06/2015 Document Reviewed: 04/04/2014 Elsevier Interactive Patient Education  2017 [employer].

## 2020-07-28 NOTE — Progress Notes (Signed)
Subjective:   Angela Hogan is a 70 y.o. female who presents for Medicare Annual (Subsequent) preventive examination.  Review of Systems: N/A      I connected with the patient today by telephone and verified that I am speaking with the correct person using two identifiers. Location patient: home Location nurse: work Persons participating in the telephone visit: patient, nurse.   I discussed the limitations, risks, security and privacy concerns of performing an evaluation and management service by telephone and the availability of in person appointments. I also discussed with the patient that there may be a patient responsible charge related to this service. The patient expressed understanding and verbally consented to this telephonic visit.        Cardiac Risk Factors include: advanced age (>83men, >40 women);Other (see comment), Risk factor comments: hyperlipidemia     Objective:    Today's Vitals   There is no height or weight on file to calculate BMI.  Advanced Directives 07/28/2020 08/12/2016  Does Patient Have a Medical Advance Directive? Yes Yes  Type of Paramedic of [place];Living will [place];Living will  Does patient want to make changes to medical advance directive? - No - Patient declined  Copy of [place] in Chart? No - copy requested No - copy requested    Current Medications (verified) Outpatient Encounter Medications as of 07/28/2020  Medication Sig  . sertraline (ZOLOFT) 25 MG tablet TAKE 1 TABLET(25 MG) BY MOUTH DAILY   No facility-administered encounter medications on file as of 07/28/2020.    Allergies (verified) Patient has no known allergies.   History: Past Medical History:  Diagnosis Date  . Arthritis   . Depression   . Hyperlipidemia   . Hypertension    off meds after BP controlled  . Motion sickness   . Vertigo    Past Surgical History:  Procedure Laterality Date  .  COLONOSCOPY WITH PROPOFOL N/A 08/12/2016   Procedure: COLONOSCOPY WITH PROPOFOL;  Surgeon: Lucilla Lame, MD;  Location: [place];  Service: Endoscopy;  Laterality: N/A;  . KNEE ARTHROSCOPY Left 07/08/2015   Procedure: ARTHROSCOPY KNEE;  Surgeon: Thornton Park, MD;  Location: [facility];  Service: Orthopedics;  Laterality: Left;  . POLYPECTOMY N/A 08/12/2016   Procedure: POLYPECTOMY;  Surgeon: Lucilla Lame, MD;  Location: [facility];  Service: Endoscopy;  Laterality: N/A;  . TOE SURGERY  2013  . TONSILECTOMY/ADENOIDECTOMY WITH MYRINGOTOMY  1974   Family History  Problem Relation Age of Onset  . Dementia Mother   . Cancer Mother        Breast  . Diabetes Father   . Heart disease Father   . Dementia Father   . Cancer Sister        Breast  . Alcohol abuse Brother   . Colon cancer Neg Hx    Social History   Socioeconomic History  . Marital status: Married    Spouse name: Not on file  . Number of children: Not on file  . Years of education: Not on file  . Highest education level: Not on file  Occupational History  . Not on file  Tobacco Use  . Smoking status: Never Smoker  . Smokeless tobacco: Never Used  Substance and Sexual Activity  . Alcohol use: Yes    Comment: occasional   . Drug use: No  . Sexual activity: Not on file  Other Topics Concern  . Not on file  Social History Narrative  Lives in [place] with husband, married 1976.    [employer] grad   3 children, 2 girls, 1 boy, all out of town   Work - Psychologist, counselling Ed, [employer]- high school   Social Determinants of Radio broadcast assistant Strain: Low Risk   . Difficulty of Paying Living Expenses: Not hard at all  Food Insecurity: No Food Insecurity  . Worried About Charity fundraiser in the Last Year: Never true  . Ran Out of Food in the Last Year: Never true  Transportation Needs: No Transportation Needs  . Lack of Transportation (Medical): No  . Lack of Transportation  (Non-Medical): No  Physical Activity: Inactive  . Days of Exercise per Week: 0 days  . Minutes of Exercise per Session: 0 min  Stress: No Stress Concern Present  . 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  No  Does the patient have any non-healing wounds?  No  Has the patient had any unintentional weight loss or weight gain?  No   Diabetes:  Is the patient diabetic?  No  If diabetic, was a CBG obtained today?  N/A Did the patient bring in their glucometer from home?  N/A How often do you monitor your CBG's? N/A.   Financial Strains and Diabetes Management:  Are you having any financial strains with the device, your supplies or your medication? N/A.  Does the patient want to be seen by Chronic Care Management for management of their diabetes?  N/A Would the patient like to be referred to a Nutritionist or for Diabetic Management?  N/A    Interpreter Needed?: No  Information entered by :: [email or web address]   Activities of Daily Living In your present state of health, do you have any difficulty performing the following activities: 07/28/2020  Hearing? N  Vision? N  Difficulty concentrating or making decisions? N  Walking or climbing stairs? N  Dressing or bathing? N  Doing errands, shopping? N  Preparing Food and eating ? N  Using the Toilet? N  In the past six months, have you accidently leaked urine? N  Do you have problems with loss of bowel  control? N  Managing your Medications? N  Managing your Finances? N  Housekeeping or managing your Housekeeping? N  Some recent data might be hidden    Patient Care Team: Tonia Ghent, MD as PCP - General (Family Medicine)  Indicate any recent Medical Services you may have received from other than [facility] providers in the past year (date may be approximate).     Assessment:   This is a routine wellness examination for Angela Hogan.  Hearing/Vision screen  Hearing Screening   125Hz  250Hz  500Hz  1000Hz  2000Hz  3000Hz  4000Hz  6000Hz  8000Hz   Right ear:           Left ear:           Vision Screening Comments: Patient gets annual eye exams   Dietary  issues and exercise activities discussed: Current Exercise Habits: The patient does not participate in regular exercise at present, Exercise limited by: None identified  Goals Addressed            This Visit's Progress   . Patient Stated       07/28/2020, I will maintain and continue medications as prescribed.       Depression Screen PHQ 2/9 Scores 07/28/2020 08/25/2017  PHQ - 2 Score 2 3  PHQ- 9 Score 2 14    Fall Risk Fall Risk  07/28/2020  Falls in the past year? 0  Number falls in past yr: 0  Injury with Fall? 0  Risk for fall due to : Medication side effect  Follow up Falls evaluation completed;Falls prevention discussed    FALL RISK PREVENTION PERTAINING TO THE HOME:  Any stairs in or around the home? Yes  If so, are there any without handrails? No  Home free of loose throw rugs in walkways, pet beds, electrical cords, etc? Yes  Adequate lighting in your home to reduce risk of falls? Yes   ASSISTIVE DEVICES UTILIZED TO PREVENT FALLS:  Life alert? No  Use of a cane, walker or w/c? No  Grab bars in the bathroom? No  Shower chair or bench in shower? No  Elevated toilet seat or a handicapped toilet? No   TIMED UP AND GO:  Was the test performed? N/A virtual/telephone visit .   Cognitive Function: MMSE - Mini Mental  State Exam 07/28/2020  Orientation to time 5  Orientation to Place 5  Registration 3  Attention/ Calculation 5  Recall 3  Language- repeat 1       Mini Cog  Mini-Cog screen was completed. Maximum score is 22. A value of 0 denotes this part of the MMSE was not completed or the patient failed this part of the Mini-Cog screening.  Immunizations Immunization History  Administered Date(s) Administered  . Influenza Inj Mdck Quad Pf 01/24/2017  . Influenza,inj,Quad PF,6+ Mos 12/05/2013, 12/21/2015  . Influenza-Unspecified 01/13/2015, 12/02/2019  . Moderna Sars-Covid-2 Vaccination 04/24/2019, 05/22/2019  . PPD Test 12/21/2015  . Tdap 05/17/2010  . Zoster 08/27/2013    TDAP status: Due, Education has been provided regarding the importance of this vaccine. Advised may receive this vaccine at local pharmacy or Health Dept. Aware to provide a copy of the vaccination record if obtained from local pharmacy or Health Dept. Verbalized acceptance and understanding.  Flu Vaccine status: Up to date  Pneumococcal vaccine status: Due, Education has been provided regarding the importance of this vaccine. Advised may receive this vaccine at local pharmacy or Health Dept. Aware to provide a copy of the vaccination record if obtained from local pharmacy or Health Dept. Verbalized acceptance and understanding.  Covid-19 vaccine status: Completed vaccines. Will bring card to physical next month so we can document in chart  Qualifies for Shingles Vaccine? Yes   Zostavax completed Yes   Shingrix Completed?: No.    Education has been provided regarding the importance of this vaccine. Patient has been advised to call insurance company to determine out of pocket expense if they have not yet received this vaccine. Advised may also receive vaccine at local pharmacy or Health Dept. Verbalized acceptance and understanding.  Screening Tests Health Maintenance  Topic Date Due  . DEXA SCAN  Never done  . PNA vac  Low Risk Adult (1 of 2 - PCV13) Never done  . COVID-19 Vaccine (3 - Booster for Moderna series) 10/22/2019  .  TETANUS/TDAP  07/29/2023 (Originally 05/16/2020)  . INFLUENZA VACCINE  10/12/2020  . MAMMOGRAM  07/02/2021  . COLONOSCOPY (Pts 45-33yrs Insurance coverage will need to be confirmed)  08/13/2026  . Hepatitis C Screening  Completed  . HPV VACCINES  Aged Out    Health Maintenance  Health Maintenance Due  Topic Date Due  . DEXA SCAN  Never done  . PNA vac Low Risk Adult (1 of 2 - PCV13) Never done  . COVID-19 Vaccine (3 - Booster for Moderna series) 10/22/2019    Colorectal cancer screening: Type of screening: Colonoscopy. Completed 08/12/2016. Repeat every 10 years  Mammogram status: scheduled in 08/2020 per patient   Bone Density status: due, Patient will call and have this scheduled for next month along with her mammogram.   Lung Cancer Screening: (Low Dose CT Chest recommended if Age 62-80 years, 30 pack-year currently smoking OR have quit w/in 15years.) does not qualify.  Additional Screening:  Hepatitis C Screening: does qualify; Completed 12/23/2015  Vision Screening: Recommended annual ophthalmology exams for early detection of glaucoma and other disorders of the eye. Is the patient up to date with their annual eye exam?  Yes  Who is the provider or what is the name of the office in which the patient attends annual eye exams? [facility]  If pt is not established with a provider, would they like to be referred to a provider to establish care? No .   Dental Screening: Recommended annual dental exams for proper oral hygiene  Community Resource Referral / Chronic Care Management: CRR required this visit?  No   CCM required this visit?  No      Plan:     I have personally reviewed and noted the following in the patient's chart:   . Medical and social history . Use of alcohol, tobacco or illicit drugs  . Current medications and supplements including  opioid prescriptions.  . Functional ability and status . Nutritional status . Physical activity . Advanced directives . List of other physicians . Hospitalizations, surgeries, and ER visits in previous 12 months . Vitals . Screenings to include cognitive, depression, and falls . Referrals and appointments  In addition, I have reviewed and discussed with patient certain preventive protocols, quality metrics, and best practice recommendations. A written personalized care plan for preventive services as well as general preventive health recommendations were provided to patient.   Due to this being a virtual/telephonic visit, the after visit summary with patients personalized plan was offered to patient via office or my-chart. Patient preferred to pick up at office at next visit or via mychart.   Janalyn Shy, LPN   [id number]

## 2020-08-23 ENCOUNTER — Encounter: Payer: Self-pay | Admitting: Family Medicine

## 2020-08-23 DIAGNOSIS — Z780 Asymptomatic menopausal state: Secondary | ICD-10-CM | POA: Insufficient documentation

## 2020-08-28 ENCOUNTER — Ambulatory Visit (INDEPENDENT_AMBULATORY_CARE_PROVIDER_SITE_OTHER): Payer: Medicare PPO | Admitting: Family Medicine

## 2020-08-28 ENCOUNTER — Encounter: Payer: Self-pay | Admitting: Family Medicine

## 2020-08-28 ENCOUNTER — Other Ambulatory Visit: Payer: Self-pay

## 2020-08-28 VITALS — BP 124/82 | HR 94 | Temp 97.9°F | Ht 64.25 in | Wt 201.0 lb

## 2020-08-28 DIAGNOSIS — F4323 Adjustment disorder with mixed anxiety and depressed mood: Secondary | ICD-10-CM | POA: Diagnosis not present

## 2020-08-28 DIAGNOSIS — G4762 Sleep related leg cramps: Secondary | ICD-10-CM

## 2020-08-28 DIAGNOSIS — Z7189 Other specified counseling: Secondary | ICD-10-CM

## 2020-08-28 DIAGNOSIS — R5383 Other fatigue: Secondary | ICD-10-CM

## 2020-08-28 DIAGNOSIS — E559 Vitamin D deficiency, unspecified: Secondary | ICD-10-CM

## 2020-08-28 DIAGNOSIS — Z0000 Encounter for general adult medical examination without abnormal findings: Secondary | ICD-10-CM

## 2020-08-28 LAB — COMPREHENSIVE METABOLIC PANEL
ALT: 26 U/L (ref 0–35)
AST: 15 U/L (ref 0–37)
Albumin: 4.8 g/dL (ref 3.5–5.2)
Alkaline Phosphatase: 62 U/L (ref 39–117)
BUN: 18 mg/dL (ref 6–23)
CO2: 30 mEq/L (ref 19–32)
Calcium: 10.3 mg/dL (ref 8.4–10.5)
Chloride: 103 mEq/L (ref 96–112)
Creatinine, Ser: 0.8 mg/dL (ref 0.40–1.20)
GFR: 76.06 mL/min (ref >60.00)
Glucose, Bld: 92 mg/dL (ref 70–99)
Potassium: 4.9 mEq/L (ref 3.5–5.1)
Sodium: 142 mEq/L (ref 135–145)
Total Bilirubin: 0.6 mg/dL (ref 0.2–1.2)
Total Protein: 7.4 g/dL (ref 6.0–8.3)

## 2020-08-28 LAB — VITAMIN D 25 HYDROXY (VIT D DEFICIENCY, FRACTURES): VITD: 16.26 ng/mL — ABNORMAL LOW (ref 30.00–100.00)

## 2020-08-28 LAB — CBC WITH DIFFERENTIAL/PLATELET
Basophils Absolute: 0.1 10*3/uL (ref 0.0–0.1)
Basophils Relative: 0.7 % (ref 0.0–3.0)
Eosinophils Absolute: 0.2 10*3/uL (ref 0.0–0.7)
Eosinophils Relative: 2.8 % (ref 0.0–5.0)
HCT: 44.3 % (ref 36.0–46.0)
Hemoglobin: 14.9 g/dL (ref 12.0–15.0)
Lymphocytes Relative: 31.4 % (ref 12.0–46.0)
Lymphs Abs: 2.7 10*3/uL (ref 0.7–4.0)
MCHC: 33.6 g/dL (ref 30.0–36.0)
MCV: 92.3 fl (ref 78.0–100.0)
Monocytes Absolute: 0.5 10*3/uL (ref 0.1–1.0)
Monocytes Relative: 6.2 % (ref 3.0–12.0)
Neutro Abs: 5 10*3/uL (ref 1.4–7.7)
Neutrophils Relative %: 58.9 % (ref 43.0–77.0)
Platelets: 341 10*3/uL (ref 150.0–400.0)
RBC: 4.8 Mil/uL (ref 3.87–5.11)
RDW: 13.3 % (ref 11.5–15.5)
WBC: 8.5 10*3/uL (ref 4.0–10.5)

## 2020-08-28 LAB — HM MAMMOGRAPHY

## 2020-08-28 LAB — TSH: TSH: 1.28 u[IU]/mL (ref 0.35–4.50)

## 2020-08-28 MED ORDER — SERTRALINE HCL 25 MG PO TABS: 25.0000 mg | ORAL_TABLET | Freq: Every day | ORAL | 3 refills | Status: DC

## 2020-08-28 NOTE — Patient Instructions (Addendum)
Go to the lab on the way out.   If you have mychart we'll likely use that to update you.    Take care.  Glad to see you. I would try taking sertraline 1 tab a day for 1 week then 2 tabs a day thereafter.

## 2020-08-28 NOTE — Progress Notes (Signed)
This visit occurred during the SARS-CoV-2 public health emergency.  Safety protocols were in place, including screening questions prior to the visit, additional usage of staff PPE, and extensive cleaning of exam room while observing appropriate contact time as indicated for disinfecting solutions.  DXA and mammogram pending for today per patient report. Tetanus 2012 Covid vaccine prev done Had shingrix.   Flu shot done yearly.   PNA vaccine d/w pt.   Advance directive d/w pt.  Husband designated if patient were incapacitated. Colonoscopy 2018 Pap not due.   History of vitamin D deficiency.  Due for follow-up labs.  See notes on labs.  She has been having nocturnal leg cramps without claudication.  Discussed maintaining adequate hydration (which she has been doing) but also gently stretching prior to bedtime.  See notes on labs.  She is going to counseling related to her stressors with her daughter.  Mood d/w pt.  Had been taking SSRI intermittently recently.  D/w pt about option.  She has tried mult meds over the years.  We talked about trial of higher dose of SSRI in the meantime with daily use.  No SI/HI.    Fatigue noted since covid.  Repeat labs pending.  No FCNAVD.  Some cough residual since covid.  No wheeze.  Cough isn't escalating.  Would defer PNA vaccine today given this.    Meds, vitals, and allergies reviewed.   ROS: Per HPI unless specifically indicated in ROS section   GEN: nad, alert and oriented HEENT: ncat NECK: supple w/o LA CV: rrr PULM: ctab, no inc wob ABD: soft, +bs EXT: no edema SKIN: no acute rash

## 2020-08-30 ENCOUNTER — Other Ambulatory Visit: Payer: Self-pay | Admitting: Family Medicine

## 2020-08-30 DIAGNOSIS — R5383 Other fatigue: Secondary | ICD-10-CM | POA: Insufficient documentation

## 2020-08-30 DIAGNOSIS — G4762 Sleep related leg cramps: Secondary | ICD-10-CM | POA: Insufficient documentation

## 2020-08-30 DIAGNOSIS — Z0000 Encounter for general adult medical examination without abnormal findings: Secondary | ICD-10-CM | POA: Insufficient documentation

## 2020-08-30 DIAGNOSIS — E559 Vitamin D deficiency, unspecified: Secondary | ICD-10-CM | POA: Insufficient documentation

## 2020-08-30 MED ORDER — VITAMIN D (ERGOCALCIFEROL) 1.25 MG (50000 UNIT) PO CAPS: 50000.0000 [IU] | ORAL_CAPSULE | ORAL | 0 refills | Status: DC

## 2020-08-30 NOTE — Assessment & Plan Note (Signed)
DXA and mammogram pending for today per patient report. Tetanus 2012 Covid vaccine prev done Had shingrix.   Flu shot done yearly.   PNA vaccine d/w pt.   Advance directive d/w pt.  Husband designated if patient were incapacitated. Colonoscopy 2018 Pap not due.

## 2020-08-30 NOTE — Assessment & Plan Note (Signed)
Reasonable to trial Zoloft 25 mg a day for 1 week then increase to 50 mg and update me as needed.  She is going to continue with counseling.  Still okay for outpatient follow-up.

## 2020-08-30 NOTE — Assessment & Plan Note (Signed)
Husband designated patient were incapacitated. 

## 2020-08-30 NOTE — Assessment & Plan Note (Signed)
See notes on labs. 

## 2020-08-30 NOTE — Assessment & Plan Note (Signed)
Noted since COVID infection, which is not uncommon.  Lungs clear.  Cough is not escalating.  Reasonable to check routine labs but I would expect this to gradually improve.  She will update me as needed.

## 2020-08-30 NOTE — Assessment & Plan Note (Signed)
See notes on labs.  Continue adequate hydration.  Discussed stretching.

## 2020-10-05 LAB — HM DEXA SCAN: HM Dexa Scan: NORMAL

## 2020-10-25 ENCOUNTER — Encounter: Payer: Self-pay | Admitting: Family Medicine

## 2020-11-25 ENCOUNTER — Other Ambulatory Visit: Payer: Self-pay | Admitting: Family Medicine

## 2020-11-25 DIAGNOSIS — E559 Vitamin D deficiency, unspecified: Secondary | ICD-10-CM

## 2020-11-27 NOTE — Telephone Encounter (Signed)
Called mrs. Pat and she stated rhat she has moved 5hrs away and wanted to know if she can do her labs down there at [facility] and have the results sent to Dr. Damita Dunnings.

## 2020-11-27 NOTE — Telephone Encounter (Signed)
Per labs in June pt is suppose to have a recheck of her Vit D level before he decides if she needs to stay on this med. Please schedule non fasting lab appt to recheck vit D, then route this back to [place] (i'm just helping)

## 2020-11-29 NOTE — Telephone Encounter (Signed)
I printed a letter for patient.  Please pull it for me to sign then send to her.  That should help her get the blood drawn at the location of her choice.  I will await the results and we'll go from there.  Thanks.

## 2020-12-14 ENCOUNTER — Telehealth: Payer: Self-pay | Admitting: Family Medicine

## 2020-12-14 NOTE — Telephone Encounter (Signed)
  Encourage patient to contact the pharmacy for refills or they can request refills through [place]:  Please schedule appointment if longer than 1 year  NEXT APPOINTMENT DATE:  MEDICATION:Vitamin D, Ergocalciferol, (DRISDOL) 1.25 MG (50000 UNIT) CAPS capsule  Is the patient out of medication?   PHARMACY:[facility] [phone] - [place]   Let patient know to contact pharmacy at the end of the day to make sure medication is ready.  Please notify patient to allow 48-72 hours to process  CLINICAL FILLS OUT ALL BELOW:   LAST REFILL:  QTY:  REFILL DATE:    OTHER COMMENTS:    Okay for refill?  Please advise

## 2020-12-14 NOTE — Telephone Encounter (Signed)
Advised patient that she will need lab work done before refill can be sent. I have mailed her the letter Dr. Damita Dunnings did for her to have labs done where she lives. Patient advised to try hospital or [facility] there for labs and have results faxed to our office.

## 2020-12-14 NOTE — Telephone Encounter (Signed)
LMTCB to discuss; also mailed letter to patient for lab work that is needed.

## 2020-12-17 ENCOUNTER — Encounter: Payer: Self-pay | Admitting: Family Medicine

## 2020-12-17 ENCOUNTER — Other Ambulatory Visit: Payer: Self-pay | Admitting: Family Medicine

## 2020-12-18 LAB — VITAMIN D 25 HYDROXY (VIT D DEFICIENCY, FRACTURES): Vit D, 25-Hydroxy: 34.3 ng/mL (ref 30.0–100.0)

## 2020-12-20 ENCOUNTER — Other Ambulatory Visit: Payer: Self-pay | Admitting: Family Medicine

## 2020-12-20 MED ORDER — VITAMIN D3 25 MCG (1000 UT) PO CAPS: 1000.0000 [IU] | ORAL_CAPSULE | Freq: Every day | ORAL | Status: DC

## 2021-05-18 ENCOUNTER — Encounter: Payer: Self-pay | Admitting: Family Medicine

## 2021-07-29 ENCOUNTER — Ambulatory Visit: Payer: Medicare PPO

## 2021-08-23 ENCOUNTER — Ambulatory Visit: Payer: Medicare PPO

## 2021-09-04 ENCOUNTER — Other Ambulatory Visit: Payer: Self-pay | Admitting: Family Medicine

## 2021-09-06 ENCOUNTER — Telehealth: Payer: Self-pay | Admitting: Family Medicine

## 2021-09-09 ENCOUNTER — Ambulatory Visit (INDEPENDENT_AMBULATORY_CARE_PROVIDER_SITE_OTHER): Payer: Medicare PPO

## 2021-09-09 VITALS — Ht 65.0 in

## 2021-09-09 DIAGNOSIS — Z0000 Encounter for general adult medical examination without abnormal findings: Secondary | ICD-10-CM | POA: Diagnosis not present

## 2021-09-09 NOTE — Patient Instructions (Addendum)
Angela Hogan , Thank you for taking time to come for your Medicare Wellness Visit. I appreciate your ongoing commitment to your health goals. Please review the following plan we discussed and let me know if I can assist you in the future.   These are the goals we discussed:  Goals       Patient Stated (pt-stated)      I will maintain and continue medications as prescribed. I hope to get off the Crestor with strict diet.        Advanced directives: Yes  Conditions/risks identified: None  Next appointment: Follow up in one year for your annual wellness visit     Preventive Care 65 Years and Older, Female Preventive care refers to lifestyle choices and visits with your health care provider that can promote health and wellness. What does preventive care include? A yearly physical exam. This is also called an annual well check. Dental exams once or twice a year. Routine eye exams. Ask your health care provider how often you should have your eyes checked. Personal lifestyle choices, including: Daily care of your teeth and gums. Regular physical activity. Eating a healthy diet. Avoiding tobacco and drug use. Limiting alcohol use. Practicing safe sex. Taking low-dose aspirin every day. Taking vitamin and mineral supplements as recommended by your health care provider. What happens during an annual well check? The services and screenings done by your health care provider during your annual well check will depend on your age, overall health, lifestyle risk factors, and family history of disease. Counseling  Your health care provider may ask you questions about your: Alcohol use. Tobacco use. Drug use. Emotional well-being. Home and relationship well-being. Sexual activity. Eating habits. History of falls. Memory and ability to understand (cognition). Work and work Statistician. Reproductive health. Screening  You may have the following tests or measurements: Height, weight, and  BMI. Blood pressure. Lipid and cholesterol levels. These may be checked every 5 years, or more frequently if you are over 53 years old. Skin check. Lung cancer screening. You may have this screening every year starting at age 3 if you have a 30-pack-year history of smoking and currently smoke or have quit within the past 15 years. Fecal occult blood test (FOBT) of the stool. You may have this test every year starting at age 78. Flexible sigmoidoscopy or colonoscopy. You may have a sigmoidoscopy every 5 years or a colonoscopy every 10 years starting at age 69. Hepatitis C blood test. Hepatitis B blood test. Sexually transmitted disease (STD) testing. Diabetes screening. This is done by checking your blood sugar (glucose) after you have not eaten for a while (fasting). You may have this done every 1-3 years. Bone density scan. This is done to screen for osteoporosis. You may have this done starting at age 100. Mammogram. This may be done every 1-2 years. Talk to your health care provider about how often you should have regular mammograms. Talk with your health care provider about your test results, treatment options, and if necessary, the need for more tests. Vaccines  Your health care provider may recommend certain vaccines, such as: Influenza vaccine. This is recommended every year. Tetanus, diphtheria, and acellular pertussis (Tdap, Td) vaccine. You may need a Td booster every 10 years. Zoster vaccine. You may need this after age 71. Pneumococcal 13-valent conjugate (PCV13) vaccine. One dose is recommended after age 47. Pneumococcal polysaccharide (PPSV23) vaccine. One dose is recommended after age 35. Talk to your health care provider about which  screenings and vaccines you need and how often you need them. This information is not intended to replace advice given to you by your health care provider. Make sure you discuss any questions you have with your health care provider. Document  Released: 03/27/2015 Document Revised: 11/18/2015 Document Reviewed: 12/30/2014 Elsevier Interactive Patient Education  2017 [employer] Prevention in the Home Falls can cause injuries. They can happen to people of all ages. There are many things you can do to make your home safe and to help prevent falls. What can I do on the outside of my home? Regularly fix the edges of walkways and driveways and fix any cracks. Remove anything that might make you trip as you walk through a door, such as a raised step or threshold. Trim any bushes or trees on the path to your home. Use bright outdoor lighting. Clear any walking paths of anything that might make someone trip, such as rocks or tools. Regularly check to see if handrails are loose or broken. Make sure that both sides of any steps have handrails. Any raised decks and porches should have guardrails on the edges. Have any leaves, snow, or ice cleared regularly. Use sand or salt on walking paths during winter. Clean up any spills in your garage right away. This includes oil or grease spills. What can I do in the bathroom? Use night lights. Install grab bars by the toilet and in the tub and shower. Do not use towel bars as grab bars. Use non-skid mats or decals in the tub or shower. If you need to sit down in the shower, use a plastic, non-slip stool. Keep the floor dry. Clean up any water that spills on the floor as soon as it happens. Remove soap buildup in the tub or shower regularly. Attach bath mats securely with double-sided non-slip rug tape. Do not have throw rugs and other things on the floor that can make you trip. What can I do in the bedroom? Use night lights. Make sure that you have a light by your bed that is easy to reach. Do not use any sheets or blankets that are too big for your bed. They should not hang down onto the floor. Have a firm chair that has side arms. You can use this for support while you get dressed. Do  not have throw rugs and other things on the floor that can make you trip. What can I do in the kitchen? Clean up any spills right away. Avoid walking on wet floors. Keep items that you use a lot in easy-to-reach places. If you need to reach something above you, use a strong step stool that has a grab bar. Keep electrical cords out of the way. Do not use floor polish or wax that makes floors slippery. If you must use wax, use non-skid floor wax. Do not have throw rugs and other things on the floor that can make you trip. What can I do with my stairs? Do not leave any items on the stairs. Make sure that there are handrails on both sides of the stairs and use them. Fix handrails that are broken or loose. Make sure that handrails are as long as the stairways. Check any carpeting to make sure that it is firmly attached to the stairs. Fix any carpet that is loose or worn. Avoid having throw rugs at the top or bottom of the stairs. If you do have throw rugs, attach them to the floor with carpet  tape. Make sure that you have a light switch at the top of the stairs and the bottom of the stairs. If you do not have them, ask someone to add them for you. What else can I do to help prevent falls? Wear shoes that: Do not have high heels. Have rubber bottoms. Are comfortable and fit you well. Are closed at the toe. Do not wear sandals. If you use a stepladder: Make sure that it is fully opened. Do not climb a closed stepladder. Make sure that both sides of the stepladder are locked into place. Ask someone to hold it for you, if possible. Clearly mark and make sure that you can see: Any grab bars or handrails. First and last steps. Where the edge of each step is. Use tools that help you move around (mobility aids) if they are needed. These include: Canes. Walkers. Scooters. Crutches. Turn on the lights when you go into a dark area. Replace any light bulbs as soon as they burn out. Set up your  furniture so you have a clear path. Avoid moving your furniture around. If any of your floors are uneven, fix them. If there are any pets around you, be aware of where they are. Review your medicines with your doctor. Some medicines can make you feel dizzy. This can increase your chance of falling. Ask your doctor what other things that you can do to help prevent falls. This information is not intended to replace advice given to you by your health care provider. Make sure you discuss any questions you have with your health care provider. Document Released: 12/25/2008 Document Revised: 08/06/2015 Document Reviewed: 04/04/2014 Elsevier Interactive Patient Education  2017 [employer].  This is a list of the screening recommended for you and due dates:  Health Maintenance  Topic Date Due   COVID-19 Vaccine (4 - Moderna series) 05/18/2020   Pneumonia Vaccine (1 - PCV) 09/10/2022*   Tetanus Vaccine  07/29/2023*   Flu Shot  10/12/2021   Mammogram  08/29/2022   Colon Cancer Screening  08/13/2026   DEXA scan (bone density measurement)  Completed   Hepatitis C Screening: USPSTF Recommendation to screen - Ages 26-79 yo.  Completed   Zoster (Shingles) Vaccine  Completed   HPV Vaccine  Aged Out  *Topic was postponed. The date shown is not the original due date.

## 2021-09-09 NOTE — Progress Notes (Signed)
Subjective:   Angela Hogan is a 71 y.o. female who presents for Medicare Annual (Subsequent) preventive examination.  Review of Systems    Virtual Visit via Telephone Note  I connected with  Angela Hogan on 09/09/21 at 11:30 AM EDT by telephone and verified that I am speaking with the correct person using two identifiers.  Location: Patient: Home Provider: Office Persons participating in the virtual visit: patient/Nurse Health Advisor   I discussed the limitations, risks, security and privacy concerns of performing an evaluation and management service by telephone and the availability of in person appointments. The patient expressed understanding and agreed to proceed.  Interactive audio and video telecommunications were attempted between this nurse and patient, however failed, due to patient having technical difficulties OR patient did not have access to video capability.  We continued and completed visit with audio only.  Some vital signs may be absent or patient reported.   Criselda Peaches, LPN  Cardiac Risk Factors include: advanced age (>41mn, >>12women);hypertension     Objective:    Today's Vitals   09/09/21 1146  Height: '5\' 5"'$  (1.651 m)   Body mass index is 33.45 kg/m.     09/09/2021   12:02 PM 07/28/2020    9:47 AM 08/12/2016    8:16 AM  Advanced Directives  Does Patient Have a Medical Advance Directive? Yes Yes Yes  Type of AParamed[place]of [place] Town[place] will [place] will [place] will  Does patient want to make changes to medical advance directive? No - Patient declined  No - Patient declined  Copy of [place] Chart? No - copy requested No - copy requested No - copy requested    Current Medications (verified) Outpatient Encounter Medications as of 09/09/2021  Medication Sig   rosuvastatin (CRESTOR) 20 MG tablet Take 20 mg by mouth daily.    Cholecalciferol (VITAMIN D3) 25 MCG (1000 UT) CAPS Take 1 capsule (1,000 Units total) by mouth daily.   sertraline (ZOLOFT) 25 MG tablet TAKE 1 TO 2 TABLETS(25 TO 50 MG) BY MOUTH DAILY   No facility-administered encounter medications on file as of 09/09/2021.    Allergies (verified) Patient has no known allergies.   History: Past Medical History:  Diagnosis Date   Arthritis    Depression    Hyperlipidemia    Hypertension    off meds after BP controlled   Motion sickness    Vertigo    Past Surgical History:  Procedure Laterality Date   COLONOSCOPY WITH PROPOFOL N/A 08/12/2016   Procedure: COLONOSCOPY WITH PROPOFOL;  Surgeon: WLucilla Lame MD;  [facility]  Service: Endoscopy;  Laterality: N/A;   KNEE ARTHROSCOPY Left 07/08/2015   Procedure: ARTHROSCOPY KNEE;  Surgeon: KThornton Park MD;  Location: [facility];  Service: Orthopedics;  Laterality: Left;   POLYPECTOMY N/A 08/12/2016   Procedure: POLYPECTOMY;  Surgeon: WLucilla Lame MD;  [facility]  Service: Endoscopy;  Laterality: N/A;   TOE SURGERY  2013   TONSILECTOMY/ADENOIDECTOMY WITH MYRINGOTOMY  1974   Family History  Problem Relation Age of Onset   Dementia Mother    Cancer Mother        Breast   Diabetes Father    Heart disease Father    Dementia Father    Cancer Sister        Breast   Alcohol abuse Brother    Colon cancer Neg Hx  Social History   Socioeconomic History   Marital status: Married    Spouse name: Not on file   Number of children: Not on file   Years of education: Not on file   Highest education level: Not on file  Occupational History   Not on file  Tobacco Use   Smoking status: Never   Smokeless tobacco: Never  Substance and Sexual Activity   Alcohol use: Yes    Comment: occasional    Drug use: No   Sexual activity: Not on file  Other Topics Concern   Not on file  Social History Narrative   Lives in [place] with husband, married 1976.    [employer]  grad   3 children, 2 girls, 1 boy, all out of town   Work - Psychologist, counselling Ed   Social Determinants of Health   Financial Resource Strain: [place]  (09/09/2021)   Overall Financial Resource Strain (CARDIA)    Difficulty of Paying Living Expenses: Not very hard  Food Insecurity: No Food Insecurity (09/09/2021)   Hunger Vital Sign    Worried About Running Out of Food in the Last Year: Never true    Ran Out of Food in the Last Year: Never true  Transportation Needs: No Transportation Needs (09/09/2021)   PRAPARE - Hydrologist (Medical): No    Lack of Transportation (Non-Medical): No  Physical Activity: Sufficiently Active (09/09/2021)   Exercise Vital Sign    Days of Exercise per Week: 5 days    Minutes of Exercise per Session: 30 min  Stress: Stress Concern Present (09/09/2021)   [place]    Feeling of Stress : Rather much  Social Connections: Socially Integrated (09/09/2021)   Social Connection and Isolation Panel [NHANES]    Frequency of Communication with Friends and Family: More than three times a week    Frequency of Social Gatherings with Friends and Family: More than three times a week    Attends Religious Services: More than 4 times per year    Active Member of [employer] or Organizations: Yes    Attends Archivist Meetings: Patient refused    Marital Status: Married    Tobacco Counseling Counseling given: Not Answered   Clinical Intake: Nutritional Risks: None Diabetes: No  How often do you need to have someone help you when you read instructions, pamphlets, or other written materials from your doctor or pharmacy?: 1 - Never  Diabetic?  No  Interpreter Needed?: No  Information entered by :: Rolene Arbour LPN   Activities of Daily Living    09/09/2021   11:59 AM 09/07/2021    3:12 PM  In your present state of health, do you have any difficulty performing the  following activities:  Hearing? 0 0  Vision? 0 0  Difficulty concentrating or making decisions? 0 0  Walking or climbing stairs? 0 0  Dressing or bathing? 0 0  Doing errands, shopping? 0 0  Preparing Food and eating ? N N  Using the Toilet? N   In the past six months, have you accidently leaked urine? N N  Do you have problems with loss of bowel control? N N  Managing your Medications? N N  Managing your Finances? N N  Housekeeping or managing your Housekeeping? N N    Patient Care Team: Tonia Ghent, MD as PCP - General (Family Medicine)  Indicate any recent Medical Services you may have  received from other than [facility] providers in the past year (date may be approximate).     Assessment:   This is a routine wellness examination for Angela Hogan.  Hearing/Vision screen Hearing Screening - Comments:: No hearing difficulty Vision Screening - Comments:: Wears glasses. Followed by [place] issues and exercise activities discussed: Exercise limited by: None identified   Goals Addressed               This Visit's Progress     Patient Stated (pt-stated)        I will maintain and continue medications as prescribed. I hope to get off the Crestor with strict diet.       Depression Screen    09/09/2021   11:55 AM 07/28/2020    9:48 AM 08/25/2017    9:53 AM  PHQ 2/9 Scores  PHQ - 2 Score 0 2 3  PHQ- 9 Score  2 14    Fall Risk    09/09/2021   12:00 PM 09/07/2021    3:12 PM 07/28/2020    9:48 AM  Fall Risk   Falls in the past year? 0 0 0  Number falls in past yr: 0 0 0  Injury with Fall? 0 0 0  Risk for fall due to : No Fall Risks  Medication side effect  Follow up   Falls evaluation completed;Falls prevention discussed    FALL RISK PREVENTION PERTAINING TO THE HOME:  Any stairs in or around the home? Yes  If so, are there any without handrails? No  Home free of loose throw rugs in walkways, pet beds, electrical cords, etc? Yes  Adequate  lighting in your home to reduce risk of falls? Yes   ASSISTIVE DEVICES UTILIZED TO PREVENT FALLS:  Life alert? No  Use of a cane, walker or w/c? No  Grab bars in the bathroom? Yes  Shower chair or bench in shower? No  Elevated toilet seat or a handicapped toilet? Yes   TIMED UP AND GO:  Was the test performed? No . Audio Visit  Cognitive Function:    07/28/2020    9:52 AM  MMSE - Mini Mental State Exam  Orientation to time 5  Orientation to Place 5  Registration 3  Attention/ Calculation 5  Recall 3  Language- repeat 1        09/09/2021   12:02 PM  6CIT Screen  What Year? 0 points  What month? 0 points  What time? 0 points  Count back from 20 0 points  Months in reverse 0 points  Repeat phrase 0 points  Total Score 0 points    Immunizations Immunization History  Administered Date(s) Administered   Influenza Inj Mdck Quad Pf 01/24/2017   Influenza,inj,Quad PF,6+ Mos 12/05/2013, 12/21/2015   Influenza-Unspecified 01/13/2015, 12/02/2019   Moderna Sars-Covid-2 Vaccination 04/24/2019, 05/22/2019, 03/23/2020   PPD Test 12/21/2015   Tdap 05/17/2010   Zoster Recombinat (Shingrix) 01/13/2020, 05/06/2020   Zoster, Live 08/27/2013    TDAP status: Up to date  Flu Vaccine status: Up to date  Pneumococcal vaccine status: Up to date  Covid-19 vaccine status: Completed vaccines  Qualifies for Shingles Vaccine? Yes   Zostavax completed Yes   Shingrix Completed?: Yes  Screening Tests Health Maintenance  Topic Date Due   COVID-19 Vaccine (4 - Moderna series) 05/18/2020   Pneumonia Vaccine 27+ Years old (1 - PCV) 09/10/2022 (Originally 11/26/2017)   TETANUS/TDAP  07/29/2023 (Originally 05/16/2020)   INFLUENZA VACCINE  10/12/2021  MAMMOGRAM  08/29/2022   COLONOSCOPY (Pts 45-42yr Insurance coverage will need to be confirmed)  08/13/2026   DEXA SCAN  Completed   Hepatitis C Screening  Completed   Zoster Vaccines- Shingrix  Completed   HPV VACCINES  Aged Out     Health Maintenance  Health Maintenance Due  Topic Date Due   COVID-19 Vaccine (4 - Moderna series) 05/18/2020    Colorectal cancer screening: Type of screening: Colonoscopy. Completed 08/12/16. Repeat every 10 years  Mammogram status: Completed 08/28/20. Repeat every year  Lung Cancer Screening: (Low Dose CT Chest recommended if Age 69-80years, 30 pack-year currently smoking OR have quit w/in 15years.) does not qualify.     Additional Screening:  Hepatitis C Screening: does qualify; Completed 12/23/15  Vision Screening: Recommended annual ophthalmology exams for early detection of glaucoma and other disorders of the eye. Is the patient up to date with their annual eye exam?  Yes  Who is the provider or what is the name of the office in which the patient attends annual eye exams? [facility] pt is not established with a provider, would they like to be referred to a provider to establish care? No .   Dental Screening: Recommended annual dental exams for proper oral hygiene  Community Resource Referral / Chronic Care Management:  CRR required this visit?  No   CCM required this visit?  No      Plan:     I have personally reviewed and noted the following in the patient's chart:   Medical and social history Use of alcohol, tobacco or illicit drugs  Current medications and supplements including opioid prescriptions.  Functional ability and status Nutritional status Physical activity Advanced directives List of other physicians Hospitalizations, surgeries, and ER visits in previous 12 months Vitals Screenings to include cognitive, depression, and falls Referrals and appointments  In addition, I have reviewed and discussed with patient certain preventive protocols, quality metrics, and best practice recommendations. A written personalized care plan for preventive services as well as general preventive health recommendations were provided to patient.      BCriselda Peaches LPN   [id number]  Nurse Notes: None

## 2021-10-29 LAB — FECAL OCCULT BLOOD, IMMUNOCHEMICAL: IFOBT: NEGATIVE

## 2021-11-23 ENCOUNTER — Encounter: Payer: Self-pay | Admitting: Family Medicine

## 2022-01-10 LAB — HM MAMMOGRAPHY

## 2022-03-04 ENCOUNTER — Encounter: Payer: Self-pay | Admitting: Family Medicine

## 2022-03-10 ENCOUNTER — Other Ambulatory Visit: Payer: Self-pay | Admitting: Family Medicine

## 2022-03-10 NOTE — Telephone Encounter (Signed)
Refill request for SERTRALINE '25MG'$  TABLETS   LOV - 08/28/20 Next OV - not scheduled Last refill - 09/07/21 #180/1

## 2022-03-10 NOTE — Telephone Encounter (Signed)
See mychart message. Patient has only been to UC there.

## 2022-03-10 NOTE — Telephone Encounter (Signed)
Check with patient.  It looks like she has established care closer to help.  I sent the rx to make sure she didn't run out.  Either way, I wish her the best.  Thanks.

## 2022-03-10 NOTE — Telephone Encounter (Signed)
See below; results are in chart from her recent labs. Look like she has only seen UC there per your other message on her refill. She has not seen anyone else.

## 2022-03-11 ENCOUNTER — Telehealth: Payer: Self-pay | Admitting: Family Medicine

## 2022-03-11 ENCOUNTER — Other Ambulatory Visit: Payer: Self-pay | Admitting: Family Medicine

## 2022-03-11 DIAGNOSIS — E559 Vitamin D deficiency, unspecified: Secondary | ICD-10-CM

## 2022-03-11 MED ORDER — VITAMIN D (ERGOCALCIFEROL) 1.25 MG (50000 UNIT) PO CAPS: 50000.0000 [IU] | ORAL_CAPSULE | ORAL | 0 refills | Status: DC

## 2022-03-11 NOTE — Telephone Encounter (Signed)
Can she come to the 8AM same day visit on 03/15/22?

## 2022-03-11 NOTE — Telephone Encounter (Signed)
I sent her vitamin D prescription.  Please send her a letter via MyChart to recheck her vitamin D level in about 3 months.  Thanks.  Dx Vitamin D deficiency [email or web address]]

## 2022-03-11 NOTE — Telephone Encounter (Signed)
Letter has been done 

## 2022-03-15 ENCOUNTER — Ambulatory Visit: Payer: Medicare PPO | Admitting: Family Medicine

## 2022-06-11 ENCOUNTER — Other Ambulatory Visit: Payer: Self-pay | Admitting: Family Medicine

## 2022-06-13 NOTE — Telephone Encounter (Signed)
Refill request for Vitamin D, Ergocalciferol, (DRISDOL) 1.25 MG (50000 UNIT) CAPS capsule   LOV - 08/28/20 Next OV - not scheduled Last refill - 03/11/22 #13/0 Last lab level was 17 on 03/02/22 and no other lab scheduled.

## 2022-06-14 NOTE — Telephone Encounter (Signed)
LMTCB

## 2022-06-14 NOTE — Telephone Encounter (Signed)
Needs repeat vit D level first.  See if she can get labs done closer to home or if she is going to be near clinic in the near future.  Thanks.

## 2022-06-15 NOTE — Telephone Encounter (Signed)
Patient called back and advised labs are needed either here or close to home before rx can be refilled. Also advised that it has been since 2022 that she has been seen and really needs a visit when possible. Patient stated that she is going to talk to her husband and see when they can get here and will call back to make an appt.

## 2022-07-28 ENCOUNTER — Encounter: Payer: Medicare PPO | Admitting: Family Medicine

## 2022-09-13 ENCOUNTER — Ambulatory Visit (INDEPENDENT_AMBULATORY_CARE_PROVIDER_SITE_OTHER): Payer: Medicare PPO

## 2022-09-13 VITALS — Ht 66.0 in | Wt 185.0 lb

## 2022-09-13 DIAGNOSIS — Z0000 Encounter for general adult medical examination without abnormal findings: Secondary | ICD-10-CM | POA: Diagnosis not present

## 2022-09-13 NOTE — Patient Instructions (Signed)
Angela Hogan , Thank you for taking time to come for your Medicare Wellness Visit. I appreciate your ongoing commitment to your health goals. Please review the following plan we discussed and let me know if I can assist you in the future.   These are the goals we discussed:  Goals       Patient Stated (pt-stated)      I will maintain and continue medications as prescribed. I hope to get off the Crestor with strict diet.      Patient Stated      09/13/2022, wants to lose weight        This is a list of the screening recommended for you and due dates:  Health Maintenance  Topic Date Due   Pneumonia Vaccine (1 of 1 - PCV) Never done   DTaP/Tdap/Td vaccine (2 - Td or Tdap) 05/16/2020   COVID-19 Vaccine (5 - 2023-24 season) 02/23/2022   Flu Shot  10/13/2022   Medicare Annual Wellness Visit  09/13/2023   Mammogram  01/11/2024   Colon Cancer Screening  08/13/2026   DEXA scan (bone density measurement)  Completed   Hepatitis C Screening  Completed   Zoster (Shingles) Vaccine  Completed   HPV Vaccine  Aged Out    Advanced directives: Please bring a copy of your POA (Power of [place]) and/or Living Will to your next appointment.   Conditions/risks identified: none  Next appointment: Follow up in one year for your annual wellness visit    Preventive Care 65 Years and Older, Female Preventive care refers to lifestyle choices and visits with your health care provider that can promote health and wellness. What does preventive care include? A yearly physical exam. This is also called an annual well check. Dental exams once or twice a year. Routine eye exams. Ask your health care provider how often you should have your eyes checked. Personal lifestyle choices, including: Daily care of your teeth and gums. Regular physical activity. Eating a healthy diet. Avoiding tobacco and drug use. Limiting alcohol use. Practicing safe sex. Taking low-dose aspirin every day. Taking vitamin and  mineral supplements as recommended by your health care provider. What happens during an annual well check? The services and screenings done by your health care provider during your annual well check will depend on your age, overall health, lifestyle risk factors, and family history of disease. Counseling  Your health care provider may ask you questions about your: Alcohol use. Tobacco use. Drug use. Emotional well-being. Home and relationship well-being. Sexual activity. Eating habits. History of falls. Memory and ability to understand (cognition). Work and work Astronomer. Reproductive health. Screening  You may have the following tests or measurements: Height, weight, and BMI. Blood pressure. Lipid and cholesterol levels. These may be checked every 5 years, or more frequently if you are over 8 years old. Skin check. Lung cancer screening. You may have this screening every year starting at age 31 if you have a 30-pack-year history of smoking and currently smoke or have quit within the past 15 years. Fecal occult blood test (FOBT) of the stool. You may have this test every year starting at age 20. Flexible sigmoidoscopy or colonoscopy. You may have a sigmoidoscopy every 5 years or a colonoscopy every 10 years starting at age 54. Hepatitis C blood test. Hepatitis B blood test. Sexually transmitted disease (STD) testing. Diabetes screening. This is done by checking your blood sugar (glucose) after you have not eaten for a while (fasting). You may have  this done every 1-3 years. Bone density scan. This is done to screen for osteoporosis. You may have this done starting at age 47. Mammogram. This may be done every 1-2 years. Talk to your health care provider about how often you should have regular mammograms. Talk with your health care provider about your test results, treatment options, and if necessary, the need for more tests. Vaccines  Your health care provider may recommend  certain vaccines, such as: Influenza vaccine. This is recommended every year. Tetanus, diphtheria, and acellular pertussis (Tdap, Td) vaccine. You may need a Td booster every 10 years. Zoster vaccine. You may need this after age 70. Pneumococcal 13-valent conjugate (PCV13) vaccine. One dose is recommended after age 8. Pneumococcal polysaccharide (PPSV23) vaccine. One dose is recommended after age 67. Talk to your health care provider about which screenings and vaccines you need and how often you need them. This information is not intended to replace advice given to you by your health care provider. Make sure you discuss any questions you have with your health care provider. Document Released: 03/27/2015 Document Revised: 11/18/2015 Document Reviewed: 12/30/2014 Elsevier Interactive Patient Education  2017 [place] Prevention in the Home Falls can cause injuries. They can happen to people of all ages. There are many things you can do to make your home safe and to help prevent falls. What can I do on the outside of my home? Regularly fix the edges of walkways and driveways and fix any cracks. Remove anything that might make you trip as you walk through a door, such as a raised step or threshold. Trim any bushes or trees on the path to your home. Use bright outdoor lighting. Clear any walking paths of anything that might make someone trip, such as rocks or tools. Regularly check to see if handrails are loose or broken. Make sure that both sides of any steps have handrails. Any raised decks and porches should have guardrails on the edges. Have any leaves, snow, or ice cleared regularly. Use sand or salt on walking paths during winter. Clean up any spills in your garage right away. This includes oil or grease spills. What can I do in the bathroom? Use night lights. Install grab bars by the toilet and in the tub and shower. Do not use towel bars as grab bars. Use non-skid mats or  decals in the tub or shower. If you need to sit down in the shower, use a plastic, non-slip stool. Keep the floor dry. Clean up any water that spills on the floor as soon as it happens. Remove soap buildup in the tub or shower regularly. Attach bath mats securely with double-sided non-slip rug tape. Do not have throw rugs and other things on the floor that can make you trip. What can I do in the bedroom? Use night lights. Make sure that you have a light by your bed that is easy to reach. Do not use any sheets or blankets that are too big for your bed. They should not hang down onto the floor. Have a firm chair that has side arms. You can use this for support while you get dressed. Do not have throw rugs and other things on the floor that can make you trip. What can I do in the kitchen? Clean up any spills right away. Avoid walking on wet floors. Keep items that you use a lot in easy-to-reach places. If you need to reach something above you, use a strong step stool  that has a grab bar. Keep electrical cords out of the way. Do not use floor polish or wax that makes floors slippery. If you must use wax, use non-skid floor wax. Do not have throw rugs and other things on the floor that can make you trip. What can I do with my stairs? Do not leave any items on the stairs. Make sure that there are handrails on both sides of the stairs and use them. Fix handrails that are broken or loose. Make sure that handrails are as long as the stairways. Check any carpeting to make sure that it is firmly attached to the stairs. Fix any carpet that is loose or worn. Avoid having throw rugs at the top or bottom of the stairs. If you do have throw rugs, attach them to the floor with carpet tape. Make sure that you have a light switch at the top of the stairs and the bottom of the stairs. If you do not have them, ask someone to add them for you. What else can I do to help prevent falls? Wear shoes that: Do not  have high heels. Have rubber bottoms. Are comfortable and fit you well. Are closed at the toe. Do not wear sandals. If you use a stepladder: Make sure that it is fully opened. Do not climb a closed stepladder. Make sure that both sides of the stepladder are locked into place. Ask someone to hold it for you, if possible. Clearly mark and make sure that you can see: Any grab bars or handrails. First and last steps. Where the edge of each step is. Use tools that help you move around (mobility aids) if they are needed. These include: Canes. Walkers. Scooters. Crutches. Turn on the lights when you go into a dark area. Replace any light bulbs as soon as they burn out. Set up your furniture so you have a clear path. Avoid moving your furniture around. If any of your floors are uneven, fix them. If there are any pets around you, be aware of where they are. Review your medicines with your doctor. Some medicines can make you feel dizzy. This can increase your chance of falling. Ask your doctor what other things that you can do to help prevent falls. This information is not intended to replace advice given to you by your health care provider. Make sure you discuss any questions you have with your health care provider. Document Released: 12/25/2008 Document Revised: 08/06/2015 Document Reviewed: 04/04/2014 Elsevier Interactive Patient Education  2017 [employer].

## 2022-09-13 NOTE — Progress Notes (Signed)
Subjective:   Angela Hogan is a 72 y.o. female who presents for Medicare Annual (Subsequent) preventive examination.  Visit Complete: Virtual  I connected with  [name] on 09/13/22 by a audio enabled telemedicine application and verified that I am speaking with the correct person using two identifiers.  Patient Location: Home  Provider Location: Office/Clinic  I discussed the limitations of evaluation and management by telemedicine. The patient expressed understanding and agreed to proceed.    Review of Systems     Cardiac Risk Factors include: advanced age (>71men, >29 women);dyslipidemia     Objective:    Today's Vitals   09/13/22 1112  Weight: 185 lb (83.9 kg)  Height: 5\' 6"  (1.676 m)   Body mass index is 29.86 kg/m.     09/13/2022   11:19 AM 09/09/2021   12:02 PM 07/28/2020    9:47 AM 08/12/2016    8:16 AM  Advanced Directives  Does Patient Have a Medical Advance Directive? Yes Yes Yes Yes  Type of Estate agent of [place];Living will Healthcare Power of [place];Living will Healthcare Power of [place];Living will Healthcare Power of [place];Living will  Does patient want to make changes to medical advance directive?  No - Patient declined  No - Patient declined  Copy of Healthcare Power of Attorney in Chart? No - copy requested No - copy requested No - copy requested No - copy requested    Current Medications (verified) Outpatient Encounter Medications as of 09/13/2022  Medication Sig   rosuvastatin (CRESTOR) 20 MG tablet Take 20 mg by mouth daily.   sertraline (ZOLOFT) 25 MG tablet TAKE 1 TO 2 TABLETS(25 TO 50 MG) BY MOUTH DAILY   Vitamin D, Ergocalciferol, (DRISDOL) 1.25 MG (50000 UNIT) CAPS capsule Take 1 capsule (50,000 Units total) by mouth every 7 (seven) days.   No facility-administered encounter medications on file as of 09/13/2022.    Allergies (verified) Patient has no known allergies.   History: Past Medical History:   Diagnosis Date   Arthritis    Depression    Hyperlipidemia    Hypertension    off meds after BP controlled   Motion sickness    Vertigo    Past Surgical History:  Procedure Laterality Date   CATARACT EXTRACTION Bilateral    08/2022   COLONOSCOPY WITH PROPOFOL N/A 08/12/2016   Procedure: COLONOSCOPY WITH PROPOFOL;  Surgeon: Midge Minium, MD;  Location: [facility];  Service: Endoscopy;  Laterality: N/A;   KNEE ARTHROSCOPY Left 07/08/2015   Procedure: ARTHROSCOPY KNEE;  Surgeon: Juanell Fairly, MD;  Location: [facility];  Service: Orthopedics;  Laterality: Left;   POLYPECTOMY N/A 08/12/2016   Procedure: POLYPECTOMY;  Surgeon: Midge Minium, MD;  Location: [facility];  Service: Endoscopy;  Laterality: N/A;   TOE SURGERY  03/15/2011   TONSILECTOMY/ADENOIDECTOMY WITH MYRINGOTOMY  03/14/1972   Family History  Problem Relation Age of Onset   Dementia Mother    Cancer Mother        Breast   Diabetes Father    Heart disease Father    Dementia Father    Cancer Sister        Breast   Alcohol abuse Brother    Colon cancer Neg Hx    Social History   Socioeconomic History   Marital status: Married    Spouse name: Not on file   Number of children: Not on file   Years of education: Not on file   Highest education level: Not  on file  Occupational History   Not on file  Tobacco Use   Smoking status: Never   Smokeless tobacco: Never  Vaping Use   Vaping Use: Never used  Substance and Sexual Activity   Alcohol use: Yes    Comment: occasional    Drug use: No   Sexual activity: Not on file  Other Topics Concern   Not on file  Social History Narrative   Lives in [place] with husband, married 1976.    [employer] grad   3 children, 2 girls, 1 boy, all out of town   Work - Academic librarian Ed   Social Determinants of Health   Financial Resource Strain: Low Risk  (09/13/2022)   Overall Financial Resource Strain (CARDIA)    Difficulty of Paying Living Expenses: Not  hard at all  Food Insecurity: No Food Insecurity (09/13/2022)   Hunger Vital Sign    Worried About Running Out of Food in the Last Year: Never true    Ran Out of Food in the Last Year: Never true  Transportation Needs: No Transportation Needs (09/13/2022)   PRAPARE - Administrator, Civil Service (Medical): No    Lack of Transportation (Non-Medical): No  Physical Activity: Sufficiently Active (09/13/2022)   Exercise Vital Sign    Days of Exercise per Week: 7 days    Minutes of Exercise per Session: 30 min  Stress: Stress Concern Present (09/13/2022)   [employer] of Occupational Health - Occupational Stress Questionnaire    Feeling of Stress : To some extent  Social Connections: Moderately Integrated (09/13/2022)   Social Connection and Isolation Panel [NHANES]    Frequency of Communication with Friends and Family: More than three times a week    Frequency of Social Gatherings with Friends and Family: More than three times a week    Attends Religious Services: More than 4 times per year    Active Member of [employer] or Organizations: No    Attends Engineer, structural: Never    Marital Status: Married    Tobacco Counseling Counseling given: Not Answered   Clinical Intake:  Pre-visit preparation completed: Yes  Pain : No/denies pain     Nutritional Status: BMI 25 -29 Overweight Nutritional Risks: None Diabetes: No  How often do you need to have someone help you when you read instructions, pamphlets, or other written materials from your doctor or pharmacy?: 1 - Never  Interpreter Needed?: No  Information entered by :: NAllen LPN   Activities of Daily Living    09/13/2022   11:14 AM  In your present state of health, do you have any difficulty performing the following activities:  Hearing? 0  Vision? 0  Difficulty concentrating or making decisions? 0  Walking or climbing stairs? 0  Dressing or bathing? 0  Doing errands, shopping? 0  Preparing Food and  eating ? N  Using the Toilet? N  In the past six months, have you accidently leaked urine? N  Do you have problems with loss of bowel control? N  Managing your Medications? N  Managing your Finances? N  Housekeeping or managing your Housekeeping? N    Patient Care Team: Joaquim Nam, MD as PCP - General (Family Medicine)  Indicate any recent Medical Services you may have received from other than [facility] providers in the past year (date may be approximate).     Assessment:   This is a routine wellness examination for Simone.  Hearing/Vision screen Hearing Screening -  Comments:: No hearing issues Vision Screening - Comments:: Regular eye exams, [facility]  Dietary issues and exercise activities discussed:     Goals Addressed             This Visit's Progress    Patient Stated       09/13/2022, wants to lose weight       Depression Screen    09/13/2022   11:20 AM 09/09/2021   11:55 AM 07/28/2020    9:48 AM 08/25/2017    9:53 AM  PHQ 2/9 Scores  PHQ - 2 Score 0 0 2 3  PHQ- 9 Score 0  2 14    Fall Risk    09/13/2022   11:19 AM 09/09/2021   12:00 PM 09/07/2021    3:12 PM 07/28/2020    9:48 AM  Fall Risk   Falls in the past year? 0 0 0 0  Number falls in past yr: 0 0 0 0  Injury with Fall? 0 0 0 0  Risk for fall due to : Medication side effect No Fall Risks  Medication side effect  Follow up Falls prevention discussed;Falls evaluation completed   Falls evaluation completed;Falls prevention discussed    MEDICARE RISK AT HOME:  Medicare Risk at Home - 09/13/22 1119     Any stairs in or around the home? Yes    If so, are there any without handrails? No    Home free of loose throw rugs in walkways, pet beds, electrical cords, etc? Yes    Adequate lighting in your home to reduce risk of falls? Yes    Life alert? No    Use of a cane, walker or w/c? No    Grab bars in the bathroom? No    Shower chair or bench in shower? No    Elevated toilet seat or a  handicapped toilet? Yes             TIMED UP AND GO:  Was the test performed?  No    Cognitive Function:    07/28/2020    9:52 AM  MMSE - Mini Mental State Exam  Orientation to time 5  Orientation to Place 5  Registration 3  Attention/ Calculation 5  Recall 3  Language- repeat 1        09/13/2022   11:21 AM 09/09/2021   12:02 PM  6CIT Screen  What Year? 0 points 0 points  What month? 0 points 0 points  What time? 0 points 0 points  Count back from 20 0 points 0 points  Months in reverse 0 points 0 points  Repeat phrase 0 points 0 points  Total Score 0 points 0 points    Immunizations Immunization History  Administered Date(s) Administered   Influenza Inj Mdck Quad Pf 01/24/2017   Influenza,inj,Quad PF,6+ Mos 12/05/2013, 12/21/2015   Influenza-Unspecified 01/13/2015, 12/02/2019   Moderna Sars-Covid-2 Vaccination 04/24/2019, 05/22/2019, 03/23/2020   PPD Test 12/21/2015   Tdap 05/17/2010   Zoster Recombinant(Shingrix) 01/13/2020, 05/06/2020   Zoster, Live 08/27/2013    TDAP status: Due, Education has been provided regarding the importance of this vaccine. Advised may receive this vaccine at local pharmacy or Health Dept. Aware to provide a copy of the vaccination record if obtained from local pharmacy or Health Dept. Verbalized acceptance and understanding.  Flu Vaccine status: Up to date  Pneumococcal vaccine status: Up to date  Covid-19 vaccine status: Completed vaccines  Qualifies for Shingles Vaccine? Yes   Zostavax completed Yes  Shingrix Completed?: Yes  Screening Tests Health Maintenance  Topic Date Due   Pneumonia Vaccine 36+ Years old (1 of 1 - PCV) Never done   DTaP/Tdap/Td (2 - Td or Tdap) 05/16/2020   COVID-19 Vaccine (5 - 2023-24 season) 02/23/2022   INFLUENZA VACCINE  10/13/2022   Medicare Annual Wellness (AWV)  09/13/2023   MAMMOGRAM  01/11/2024   Colonoscopy  08/13/2026   DEXA SCAN  Completed   Hepatitis C Screening  Completed    Zoster Vaccines- Shingrix  Completed   HPV VACCINES  Aged Out    Health Maintenance  Health Maintenance Due  Topic Date Due   Pneumonia Vaccine 19+ Years old (1 of 1 - PCV) Never done   DTaP/Tdap/Td (2 - Td or Tdap) 05/16/2020   COVID-19 Vaccine (5 - 2023-24 season) 02/23/2022    Colorectal cancer screening: Type of screening: Colonoscopy. Completed 08/12/2016. Repeat every 10 years  Mammogram status: Completed 01/10/2022. Repeat every year  Bone Density status: Completed 10/05/2020.  Lung Cancer Screening: (Low Dose CT Chest recommended if Age 53-80 years, 20 pack-year currently smoking OR have quit w/in 15years.) does not qualify.   Lung Cancer Screening Referral: no  Additional Screening:  Hepatitis C Screening: does qualify; Completed 12/23/2015  Vision Screening: Recommended annual ophthalmology exams for early detection of glaucoma and other disorders of the eye. Is the patient up to date with their annual eye exam?  Yes  Who is the provider or what is the name of the office in which the patient attends annual eye exams? [facility] If pt is not established with a provider, would they like to be referred to a provider to establish care? No .   Dental Screening: Recommended annual dental exams for proper oral hygiene  Diabetic Foot Exam: n/a  Community Resource Referral / Chronic Care Management: CRR required this visit?  No   CCM required this visit?  No     Plan:     I have personally reviewed and noted the following in the patient's chart:   Medical and social history Use of alcohol, tobacco or illicit drugs  Current medications and supplements including opioid prescriptions. Patient is not currently taking opioid prescriptions. Functional ability and status Nutritional status Physical activity Advanced directives List of other physicians Hospitalizations, surgeries, and ER visits in previous 12 months Vitals Screenings to include cognitive,  depression, and falls Referrals and appointments  In addition, I have reviewed and discussed with patient certain preventive protocols, quality metrics, and best practice recommendations. A written personalized care plan for preventive services as well as general preventive health recommendations were provided to patient.     Barb Merino, LPN   4/0/9811   After Visit Summary: (MyChart) Due to this being a telephonic visit, the after visit summary with patients personalized plan was offered to patient via MyChart   Nurse Notes: none

## 2022-10-27 ENCOUNTER — Ambulatory Visit: Payer: Medicare PPO | Admitting: Family Medicine

## 2022-10-27 ENCOUNTER — Encounter (INDEPENDENT_AMBULATORY_CARE_PROVIDER_SITE_OTHER): Payer: Self-pay

## 2022-11-04 ENCOUNTER — Telehealth: Payer: Self-pay | Admitting: Family Medicine

## 2022-11-04 DIAGNOSIS — E7849 Other hyperlipidemia: Secondary | ICD-10-CM

## 2022-11-04 DIAGNOSIS — E559 Vitamin D deficiency, unspecified: Secondary | ICD-10-CM

## 2022-11-04 NOTE — Telephone Encounter (Signed)
Prescription Request  11/04/2022  LOV: Visit date not found  What is the name of the medication or equipment? Vitamin D, Ergocalciferol, (DRISDOL) 1.25 MG (50000 UNIT) CAPS capsule   Have you contacted your pharmacy to request a refill? Yes   Which pharmacy would yox: [phone]  [id number] - [place], [place] - [address] AT [facility] [address] [place] [address] Phone: [phone] Fax: [phone]    Patient notified that their request is being sent to the clinical staff for review and that they should receive a response within 2 business days.   Please advise at Mobile [phone] (mobile)

## 2022-11-04 NOTE — Telephone Encounter (Signed)
LOV - 08/28/20 NOV - 12/01/22 RF - 03/11/22 #13/0 Last lab done was 03/02/22 and was 17 No future labs scheduled

## 2022-11-06 NOTE — Telephone Encounter (Signed)
Has she had a recent/interval vitamin D level that we do not have entered in the EMR?  If so, let me know.  If not then she needs a repeat vitamin D level collected.  Let me know if she needs an order or letter sent for an order.  Thanks.

## 2022-11-07 NOTE — Telephone Encounter (Signed)
LMTCB

## 2022-11-08 NOTE — Telephone Encounter (Addendum)
I looked ahead at her upcoming visit.  She is due for yearly labs.  I put all the orders on a letter so she could get them all done together.  Letter printed and on Jessica's desk, please send.  Thanks.

## 2022-11-08 NOTE — Telephone Encounter (Signed)
Spoke with patient and she has not had Vit D checked since what we have last from dec 2023. She wants order sent to [facility] in [facility].

## 2022-11-09 NOTE — Telephone Encounter (Signed)
Orders faxed

## 2022-12-01 ENCOUNTER — Ambulatory Visit (INDEPENDENT_AMBULATORY_CARE_PROVIDER_SITE_OTHER): Payer: Medicare PPO | Admitting: Family Medicine

## 2022-12-01 ENCOUNTER — Ambulatory Visit
Admission: RE | Admit: 2022-12-01 | Discharge: 2022-12-01 | Disposition: A | Discharge status: Home / Self Care | Payer: Medicare PPO | Source: Ambulatory Visit | Attending: Family Medicine | Admitting: Family Medicine

## 2022-12-01 ENCOUNTER — Telehealth: Payer: Self-pay | Admitting: Family Medicine

## 2022-12-01 ENCOUNTER — Encounter: Payer: Self-pay | Admitting: Family Medicine

## 2022-12-01 VITALS — BP 124/82 | HR 86 | Temp 97.9°F | Ht 66.0 in | Wt 210.0 lb

## 2022-12-01 DIAGNOSIS — E7849 Other hyperlipidemia: Secondary | ICD-10-CM

## 2022-12-01 DIAGNOSIS — R5383 Other fatigue: Secondary | ICD-10-CM

## 2022-12-01 DIAGNOSIS — Z23 Encounter for immunization: Secondary | ICD-10-CM | POA: Diagnosis not present

## 2022-12-01 DIAGNOSIS — M549 Dorsalgia, unspecified: Secondary | ICD-10-CM | POA: Diagnosis not present

## 2022-12-01 DIAGNOSIS — Z7189 Other specified counseling: Secondary | ICD-10-CM

## 2022-12-01 DIAGNOSIS — M5430 Sciatica, unspecified side: Secondary | ICD-10-CM

## 2022-12-01 DIAGNOSIS — E559 Vitamin D deficiency, unspecified: Secondary | ICD-10-CM | POA: Diagnosis not present

## 2022-12-01 DIAGNOSIS — F4323 Adjustment disorder with mixed anxiety and depressed mood: Secondary | ICD-10-CM

## 2022-12-01 DIAGNOSIS — Z0000 Encounter for general adult medical examination without abnormal findings: Secondary | ICD-10-CM

## 2022-12-01 LAB — CBC WITH DIFFERENTIAL/PLATELET
Basophils Absolute: 0 10*3/uL (ref 0.0–0.1)
Basophils Relative: 0.4 % (ref 0.0–3.0)
Eosinophils Absolute: 0.2 10*3/uL (ref 0.0–0.7)
Eosinophils Relative: 3.4 % (ref 0.0–5.0)
HCT: 44.2 % (ref 36.0–46.0)
Hemoglobin: 14.5 g/dL (ref 12.0–15.0)
Lymphocytes Relative: 32.8 % (ref 12.0–46.0)
Lymphs Abs: 1.9 10*3/uL (ref 0.7–4.0)
MCHC: 32.9 g/dL (ref 30.0–36.0)
MCV: 93.2 fl (ref 78.0–100.0)
Monocytes Absolute: 0.3 10*3/uL (ref 0.1–1.0)
Monocytes Relative: 5.5 % (ref 3.0–12.0)
Neutro Abs: 3.4 10*3/uL (ref 1.4–7.7)
Neutrophils Relative %: 57.9 % (ref 43.0–77.0)
Platelets: 283 10*3/uL (ref 150.0–400.0)
RBC: 4.74 Mil/uL (ref 3.87–5.11)
RDW: 13.2 % (ref 11.5–15.5)
WBC: 5.9 10*3/uL (ref 4.0–10.5)

## 2022-12-01 LAB — COMPREHENSIVE METABOLIC PANEL
ALT: 29 U/L (ref 0–35)
AST: 20 U/L (ref 0–37)
Albumin: 4.5 g/dL (ref 3.5–5.2)
Alkaline Phosphatase: 65 U/L (ref 39–117)
BUN: 14 mg/dL (ref 6–23)
CO2: 29 mEq/L (ref 19–32)
Calcium: 9.6 mg/dL (ref 8.4–10.5)
Chloride: 103 mEq/L (ref 96–112)
Creatinine, Ser: 0.68 mg/dL (ref 0.40–1.20)
GFR: 88.49 mL/min (ref >60.00)
Glucose, Bld: 123 mg/dL — ABNORMAL HIGH (ref 70–99)
Potassium: 4.2 mEq/L (ref 3.5–5.1)
Sodium: 141 mEq/L (ref 135–145)
Total Bilirubin: 0.6 mg/dL (ref 0.2–1.2)
Total Protein: 7.1 g/dL (ref 6.0–8.3)

## 2022-12-01 LAB — VITAMIN D 25 HYDROXY (VIT D DEFICIENCY, FRACTURES): VITD: 49.69 ng/mL (ref 30.00–100.00)

## 2022-12-01 LAB — LIPID PANEL
Cholesterol: 222 mg/dL — ABNORMAL HIGH (ref 0–200)
HDL: 65.7 mg/dL (ref >39.00)
LDL Cholesterol: 122 mg/dL — ABNORMAL HIGH (ref 0–99)
NonHDL: 156.25
Total CHOL/HDL Ratio: 3
Triglycerides: 172 mg/dL — ABNORMAL HIGH (ref 0.0–149.0)
VLDL: 34.4 mg/dL (ref 0.0–40.0)

## 2022-12-01 LAB — TSH: TSH: 0.82 u[IU]/mL (ref 0.35–5.50)

## 2022-12-01 MED ORDER — SERTRALINE HCL 100 MG PO TABS: 100.0000 mg | ORAL_TABLET | Freq: Every day | ORAL | 3 refills | Status: DC

## 2022-12-01 MED ORDER — ROSUVASTATIN CALCIUM 20 MG PO TABS: 20.0000 mg | ORAL_TABLET | Freq: Every day | ORAL | 3 refills | Status: DC

## 2022-12-01 NOTE — Patient Instructions (Addendum)
Mammogram after 01/12/23.  Flu shot today.   Go to the lab on the way out.   If you have mychart we'll likely use that to update you.    Take care.  Glad to see you.  Try 75mg  sertraline in the meantime.  Increase to 100mg  sertraline after 1 month if needed/tolerated.  Keep going with counseling.   Thanks for your effort.

## 2022-12-01 NOTE — Progress Notes (Addendum)
Mood d/w pt.  Stressors d/w pt- with relationship with her daughter- this is a long standing issue.  Stress eating.  Still on zoloft, 50mg  isn't helping.  Her other kids are supportive of patient.  Family discord is a sig stressor for patient.  Patient is still in counseling.    Elevated Cholesterol: Using medications without problems: yes Muscle aches: she is having L leg pain, from the foot up to the L buttock.  She saw Dr. Ernest Pine in the meantime with ortho.  Only noted laying down.  Better with tylenol and getting up.  No pain working/walking.  Going on for months.  No pain last night when she slept on a different mattress.  See notes on imaging. Diet compliance: d/w pt.  Exercise: d/w pt.    Vit D def. Recheck pending.  Fatigue noted.    Colonoscopy 2018.   She has yearly mammogram.   DXA 2022 zostavax 2022 Flu shot 2024  PNA 2023 Tdap 2022 Advance directive d/w pt.  Husband designated if patient were incapacitated. Diet and exercise d/w pt.    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    Meds, vitals, and allergies reviewed.   ROS: Per HPI unless specifically indicated in ROS section   GEN: nad, alert and oriented HEENT: ncat NECK: supple w/o LA CV: rrr. PULM: ctab, no inc wob ABD: soft, +bs EXT: no edema SKIN: well perfused.  SLR neg. S/S wnl BLE.  Normal L hip int rotation.

## 2022-12-01 NOTE — Telephone Encounter (Signed)
Pt called stating she forgot to ask Para March during cpe on today, 9/19, about advice on what can she do about a lot of skin tags under her arms? Call back # [phone]

## 2022-12-02 NOTE — Telephone Encounter (Signed)
Could try OTC skin tag removal patches.  We can take them off at OV if needed.

## 2022-12-02 NOTE — Telephone Encounter (Signed)
Recommendations given to patient's husband.

## 2022-12-04 ENCOUNTER — Other Ambulatory Visit: Payer: Self-pay | Admitting: Family Medicine

## 2022-12-04 DIAGNOSIS — M549 Dorsalgia, unspecified: Secondary | ICD-10-CM | POA: Insufficient documentation

## 2022-12-04 MED ORDER — VITAMIN D3 50 MCG (2000 UT) PO CAPS: 2000.0000 [IU] | ORAL_CAPSULE | Freq: Every day | ORAL | Status: DC

## 2022-12-04 NOTE — Assessment & Plan Note (Signed)
Vit D def. Recheck pending.  Fatigue noted.   See notes on labs.

## 2022-12-04 NOTE — Assessment & Plan Note (Signed)
Likely mechanical, likely exacerbated by mattress.  She slept in a different bed last night and had no pain.  She only has pain when laying in her bed at home.  She has a reassuring exam.  She can see about changing her mattress at home and then update me as needed.

## 2022-12-04 NOTE — Assessment & Plan Note (Signed)
Inc to 75mg  sertraline in the meantime.  Increase to 100mg  sertraline after 1 month if needed/tolerated.  Keep going with counseling. Update me as needed.  Discussed her situation with her daughter.

## 2022-12-04 NOTE — Assessment & Plan Note (Signed)
Colonoscopy 2018.   She has yearly mammogram.   DXA 2022 zostavax 2022 Flu shot 2024  PNA 2023 Tdap 2022 Advance directive d/w pt.  Husband designated if patient were incapacitated. Diet and exercise d/w pt.    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

## 2022-12-04 NOTE — Assessment & Plan Note (Signed)
Continue work on diet and exercise.  Continue Crestor.  See notes on labs.

## 2022-12-04 NOTE — Assessment & Plan Note (Signed)
Advance directive- d/w pt. Husband designated if patient were incapacitated.

## 2022-12-07 ENCOUNTER — Telehealth: Payer: Self-pay | Admitting: Family Medicine

## 2022-12-07 NOTE — Telephone Encounter (Signed)
PT returned call regarding labs, asked for a call back when possible

## 2022-12-08 ENCOUNTER — Other Ambulatory Visit: Payer: Self-pay | Admitting: Family Medicine

## 2022-12-09 NOTE — Telephone Encounter (Signed)
LMTCB

## 2022-12-12 NOTE — Telephone Encounter (Signed)
Patient aware of results.

## 2023-02-08 LAB — HM MAMMOGRAPHY

## 2023-05-11 ENCOUNTER — Other Ambulatory Visit: Payer: Self-pay | Admitting: Family Medicine

## 2023-05-11 MED ORDER — VITAMIN D3 50 MCG (2000 UT) PO CAPS: 2000.0000 [IU] | ORAL_CAPSULE | Freq: Every day | ORAL | 0 refills | Status: AC

## 2023-05-12 ENCOUNTER — Other Ambulatory Visit: Payer: Self-pay | Admitting: Family Medicine

## 2023-05-12 ENCOUNTER — Telehealth: Payer: Self-pay

## 2023-05-12 NOTE — Telephone Encounter (Signed)
 Copied from CRM [id number]. Topic: Clinical - Medication Refill >> May 12, 2023  8:49 AM Kathryne Eriksson wrote: Most Recent Primary Care Visit:  Provider: Joaquim Nam  Department: [facility]  Visit Type: PHYSICAL  Date: 12/01/2022  Medication: Cholecalciferol (VITAMIN D3) 50 MCG (2000 UT) capsule  Has the patient contacted their pharmacy? Yes (Agent: If no, request that the patient contact the pharmacy for the refill. If patient does not wish to contact the pharmacy document the reason why and proceed with request.) (Agent: If yes, when and what did the pharmacy advise?)  Is this the correct pharmacy for this prescription? Yes If no, delete pharmacy and type the correct one.  This is the patient's preferred pharmacy:   [id number] - [place], [place] - [address] AT [facility] & [address] [place] [address] Phone: [phone] Fax: [phone]   Has the prescription been filled recently? No  Is the patient out of the medication? Yes  Has the patient been seen for an appointment in the last year OR does the patient have an upcoming appointment? Yes  Can we respond through MyChart? Yes  Agent: Please be advised that Rx refills may take up to 3 business days. We ask that you follow-up with your pharmacy.

## 2023-05-12 NOTE — Telephone Encounter (Signed)
 Copied from CRM [id number]. Topic: Clinical - Medication Question >> May 12, 2023  3:27 PM Angela Hogan wrote: Reason for CRM: Patient called in due to refill refusal advised note "this is an OTC medication" patient stated she was told by Dr. Para March what she need is higher than what's provided from OTC. Would also like to know what exactly to get from OTC dosage etc. Please call [phone]

## 2023-05-12 NOTE — Telephone Encounter (Signed)
 Spoke to pt. Advised her that according to the labs from September 2024, she is to take Vit D3 2000 units daily

## 2023-11-29 ENCOUNTER — Other Ambulatory Visit: Payer: Self-pay | Admitting: Family Medicine

## 2023-11-29 DIAGNOSIS — E7849 Other hyperlipidemia: Secondary | ICD-10-CM

## 2023-11-29 DIAGNOSIS — F4323 Adjustment disorder with mixed anxiety and depressed mood: Secondary | ICD-10-CM

## 2023-12-01 NOTE — Telephone Encounter (Signed)
 E-scribed refills.  Plz schedule annual exam for additional refills.

## 2023-12-04 NOTE — Telephone Encounter (Signed)
 Lvm and sent mychart

## 2023-12-05 ENCOUNTER — Telehealth: Payer: Self-pay | Admitting: Family Medicine

## 2023-12-05 NOTE — Telephone Encounter (Signed)
 LVM to schedule CPE and Labs

## 2023-12-05 NOTE — Telephone Encounter (Signed)
 Lvm that Dr Cleatus does not have opening to squeeze her in on same day with Husband but we would be happy to help her schedule.   Copied from CRM [id number]. Topic: Appointments - Scheduling Inquiry for Clinic >> Dec 05, 2023 10:37 AM Henretta I wrote: Reason for CRM: Patients called because her husband has appt scheduled for Friday 9/26 at 12:00pm and wanted to know if  Dr. Cleatus could get her in same day for a physical. Please call patient to let her know if she can or cannot be squeezed in that day.

## 2024-02-29 LAB — HM MAMMOGRAPHY

## 2024-03-01 ENCOUNTER — Other Ambulatory Visit: Payer: Self-pay | Admitting: Family Medicine

## 2024-03-01 DIAGNOSIS — E7849 Other hyperlipidemia: Secondary | ICD-10-CM

## 2024-03-01 DIAGNOSIS — F4323 Adjustment disorder with mixed anxiety and depressed mood: Secondary | ICD-10-CM

## 2024-03-01 LAB — HM MAMMOGRAPHY

## 2024-03-04 ENCOUNTER — Encounter: Payer: Self-pay | Admitting: Family Medicine

## 2024-03-04 NOTE — Telephone Encounter (Signed)
 E-scribed refills.  Pls schedule annual exam and fasting labs for additional refills.

## 2024-03-05 ENCOUNTER — Ambulatory Visit: Payer: Self-pay | Admitting: Family Medicine

## 2024-03-05 ENCOUNTER — Telehealth: Payer: Self-pay

## 2024-03-05 DIAGNOSIS — E559 Vitamin D deficiency, unspecified: Secondary | ICD-10-CM

## 2024-03-05 DIAGNOSIS — E7849 Other hyperlipidemia: Secondary | ICD-10-CM

## 2024-03-05 NOTE — Telephone Encounter (Signed)
 Copied from CRM #[id number]. Topic: General - Other >> Mar 04, 2024  3:24 PM Alfonso HERO wrote: Reason for CRM: patient called to see if she can have her labs done prior to her physical date because she was told she needs to have surgery to remove a mass from her breast and wants to have them done before her surgery. She is asking for someone to call her back.

## 2024-03-05 NOTE — Telephone Encounter (Signed)
 Left voicemail for patient to call the office back.

## 2024-03-05 NOTE — Telephone Encounter (Signed)
 Spoke with patient.  Informed her of labs ordered.  Pt states she is getting them done in [place]  at [facility].

## 2024-03-05 NOTE — Addendum Note (Signed)
 Addended by: CLEATUS ARLYSS RAMAN on: 03/05/2024 01:04 PM   Modules accepted: Orders

## 2024-03-05 NOTE — Telephone Encounter (Signed)
 Noted.  Thanks.  I changed the orders so she should be able to get them done via [facility].

## 2024-03-05 NOTE — Telephone Encounter (Signed)
 I saw her result note prior to seeing this phone note. Please see the result note.    I put in the orders and please tell her that I will be thinking about her.  Thanks.

## 2024-03-05 NOTE — Addendum Note (Signed)
 Addended by: CLEATUS ARLYSS RAMAN on: 03/05/2024 02:09 PM   Modules accepted: Orders

## 2024-03-06 ENCOUNTER — Ambulatory Visit: Payer: Self-pay | Admitting: Family Medicine

## 2024-03-06 LAB — CBC WITH DIFFERENTIAL/PLATELET
Basophils Absolute: 0 x10E3/uL (ref 0.0–0.2)
Basos: 1 %
EOS (ABSOLUTE): 0.3 x10E3/uL (ref 0.0–0.4)
Eos: 4 %
Hematocrit: 45.2 % (ref 34.0–46.6)
Hemoglobin: 14.7 g/dL (ref 11.1–15.9)
Immature Grans (Abs): 0 x10E3/uL (ref 0.0–0.1)
Immature Granulocytes: 0 %
Lymphocytes Absolute: 1.9 x10E3/uL (ref 0.7–3.1)
Lymphs: 25 %
MCH: 30.9 pg (ref 26.6–33.0)
MCHC: 32.5 g/dL (ref 31.5–35.7)
MCV: 95 fL (ref 79–97)
Monocytes Absolute: 0.4 x10E3/uL (ref 0.1–0.9)
Monocytes: 5 %
Neutrophils Absolute: 4.9 x10E3/uL (ref 1.4–7.0)
Neutrophils: 65 %
Platelets: 291 x10E3/uL (ref 150–450)
RBC: 4.75 x10E6/uL (ref 3.77–5.28)
RDW: 12.5 % (ref 11.7–15.4)
WBC: 7.5 x10E3/uL (ref 3.4–10.8)

## 2024-03-06 LAB — COMPREHENSIVE METABOLIC PANEL WITH GFR
ALT: 29 IU/L (ref 0–32)
AST: 21 IU/L (ref 0–40)
Albumin: 4.8 g/dL (ref 3.8–4.8)
Alkaline Phosphatase: 79 IU/L (ref 49–135)
BUN/Creatinine Ratio: 19 (ref 12–28)
BUN: 16 mg/dL (ref 8–27)
Bilirubin Total: 0.3 mg/dL (ref 0.0–1.2)
CO2: 27 mmol/L (ref 20–29)
Calcium: 9.9 mg/dL (ref 8.7–10.3)
Chloride: 101 mmol/L (ref 96–106)
Creatinine, Ser: 0.84 mg/dL (ref 0.57–1.00)
Globulin, Total: 2.1 g/dL (ref 1.5–4.5)
Glucose: 108 mg/dL — ABNORMAL HIGH (ref 70–99)
Potassium: 4.2 mmol/L (ref 3.5–5.2)
Sodium: 141 mmol/L (ref 134–144)
Total Protein: 6.9 g/dL (ref 6.0–8.5)
eGFR: 74 mL/min/1.73

## 2024-03-06 LAB — LIPID PANEL
Chol/HDL Ratio: 3.2 ratio (ref 0.0–4.4)
Cholesterol, Total: 222 mg/dL — ABNORMAL HIGH (ref 100–199)
HDL: 70 mg/dL
LDL Chol Calc (NIH): 90 mg/dL (ref 0–99)
Triglycerides: 380 mg/dL — ABNORMAL HIGH (ref 0–149)
VLDL Cholesterol Cal: 62 mg/dL — ABNORMAL HIGH (ref 5–40)

## 2024-03-06 LAB — TSH: TSH: 1.51 u[IU]/mL (ref 0.450–4.500)

## 2024-03-06 LAB — VITAMIN D 25 HYDROXY (VIT D DEFICIENCY, FRACTURES): Vit D, 25-Hydroxy: 32.9 ng/mL (ref 30.0–100.0)

## 2024-03-15 ENCOUNTER — Encounter: Payer: Self-pay | Admitting: Family Medicine

## 2024-03-21 ENCOUNTER — Encounter: Payer: Self-pay | Admitting: Family Medicine

## 2024-03-22 ENCOUNTER — Encounter: Admitting: Family Medicine

## 2024-03-24 ENCOUNTER — Telehealth: Payer: Self-pay | Admitting: Family Medicine

## 2024-03-24 ENCOUNTER — Ambulatory Visit: Payer: Self-pay | Admitting: Family Medicine

## 2024-03-24 NOTE — Telephone Encounter (Addendum)
 Please check with patient and send a note to Dr. Othel.    LAFAYE CHARLENA OTHEL, MD [address], [place], [place] [address] Phone: [phone] Fax: [phone]  I got a note from Dr. Othel about OSA and possible zepbound use.  We can talk about this when patient is in for the next visit, but I would defer this topic until she has her mammogram addressed.  Thanks.

## 2024-03-27 NOTE — Telephone Encounter (Signed)
Attempted to call pt, no answer. LMTCB

## 2024-03-27 NOTE — Telephone Encounter (Unsigned)
 Copied from CRM #[id number]. Topic: General - Other >> Mar 27, 2024 10:43 AM Robinson H wrote: Reason for CRM: Patient returning call to office, agent advised patient of message from provider, patient states yes she has had it done and results are back. Patient coming in on 1/27 to go over results, states test was done on 03/20/2024 at [facility] in [place].  Bruna [phone]

## 2024-03-29 ENCOUNTER — Encounter: Payer: Self-pay | Admitting: Family Medicine

## 2024-04-04 NOTE — Telephone Encounter (Signed)
 Called patient she has canceled appointment for office visit. She has surgery a few days after and would like to call after she is healed up to make appointment.  No further action needed at this time.

## 2024-04-09 ENCOUNTER — Ambulatory Visit: Admitting: Family Medicine

## 2024-04-12 ENCOUNTER — Ambulatory Visit: Admitting: Family Medicine
